# Patient Record
Sex: Female | Born: 1935 | Race: White | Hispanic: No | Smoking: Never smoker
Health system: Southern US, Community
[De-identification: ages and names within clinical notes are randomized; demographics above are authoritative.]

## PROBLEM LIST (undated history)

## (undated) DIAGNOSIS — H409 Unspecified glaucoma: Secondary | ICD-10-CM

## (undated) DIAGNOSIS — N289 Disorder of kidney and ureter, unspecified: Secondary | ICD-10-CM

## (undated) DIAGNOSIS — H548 Legal blindness, as defined in USA: Secondary | ICD-10-CM

## (undated) DIAGNOSIS — K219 Gastro-esophageal reflux disease without esophagitis: Secondary | ICD-10-CM

## (undated) DIAGNOSIS — S32030A Wedge compression fracture of third lumbar vertebra, initial encounter for closed fracture: Secondary | ICD-10-CM

## (undated) DIAGNOSIS — R296 Repeated falls: Secondary | ICD-10-CM

## (undated) DIAGNOSIS — R4189 Other symptoms and signs involving cognitive functions and awareness: Secondary | ICD-10-CM

## (undated) DIAGNOSIS — Z9981 Dependence on supplemental oxygen: Secondary | ICD-10-CM

## (undated) DIAGNOSIS — F0390 Unspecified dementia without behavioral disturbance: Secondary | ICD-10-CM

## (undated) DIAGNOSIS — I2699 Other pulmonary embolism without acute cor pulmonale: Secondary | ICD-10-CM

## (undated) DIAGNOSIS — W19XXXA Unspecified fall, initial encounter: Secondary | ICD-10-CM

## (undated) DIAGNOSIS — S32020A Wedge compression fracture of second lumbar vertebra, initial encounter for closed fracture: Secondary | ICD-10-CM

## (undated) HISTORY — DX: Wedge compression fracture of second lumbar vertebra, initial encounter for closed fracture: S32.020A

## (undated) HISTORY — PX: LAPAROSCOPIC CHOLECYSTECTOMY: SUR755

## (undated) HISTORY — DX: Wedge compression fracture of third lumbar vertebra, initial encounter for closed fracture: S32.030A

## (undated) HISTORY — DX: Unspecified glaucoma: H40.9

## (undated) HISTORY — DX: Repeated falls: R29.6

## (undated) HISTORY — DX: Gastro-esophageal reflux disease without esophagitis: K21.9

## (undated) HISTORY — PX: VAGINAL HYSTERECTOMY: SUR661

## (undated) HISTORY — PX: GLAUCOMA SURGERY: SHX656

## (undated) HISTORY — DX: Other symptoms and signs involving cognitive functions and awareness: R41.89

## (undated) HISTORY — DX: Unspecified fall, initial encounter: W19.XXXA

## (undated) HISTORY — PX: EXCISIONAL HEMORRHOIDECTOMY: SHX1541

---

## 2015-07-18 DIAGNOSIS — S32030A Wedge compression fracture of third lumbar vertebra, initial encounter for closed fracture: Secondary | ICD-10-CM

## 2015-07-18 DIAGNOSIS — S32020A Wedge compression fracture of second lumbar vertebra, initial encounter for closed fracture: Secondary | ICD-10-CM

## 2015-07-18 HISTORY — DX: Wedge compression fracture of second lumbar vertebra, initial encounter for closed fracture: S32.020A

## 2015-07-18 HISTORY — DX: Wedge compression fracture of third lumbar vertebra, initial encounter for closed fracture: S32.030A

## 2015-08-07 ENCOUNTER — Non-Acute Institutional Stay (SKILLED_NURSING_FACILITY): Payer: Medicare Other | Admitting: Internal Medicine

## 2015-08-07 DIAGNOSIS — W19XXXD Unspecified fall, subsequent encounter: Secondary | ICD-10-CM

## 2015-08-07 DIAGNOSIS — M810 Age-related osteoporosis without current pathological fracture: Secondary | ICD-10-CM | POA: Diagnosis not present

## 2015-08-07 DIAGNOSIS — N3000 Acute cystitis without hematuria: Secondary | ICD-10-CM

## 2015-08-07 DIAGNOSIS — H409 Unspecified glaucoma: Secondary | ICD-10-CM | POA: Diagnosis not present

## 2015-08-07 DIAGNOSIS — S32020S Wedge compression fracture of second lumbar vertebra, sequela: Secondary | ICD-10-CM

## 2015-08-07 DIAGNOSIS — S32030S Wedge compression fracture of third lumbar vertebra, sequela: Secondary | ICD-10-CM | POA: Diagnosis not present

## 2015-08-07 DIAGNOSIS — R4189 Other symptoms and signs involving cognitive functions and awareness: Secondary | ICD-10-CM | POA: Diagnosis not present

## 2015-08-07 NOTE — Progress Notes (Signed)
MRN: [id number] Name: Kaitlyn Holloway  Sex: female Age: 81 y.o. DOB: 26-Sep-1926  PSC #: Pernell Dupre fram Facility/Room:501 Level Of Care: SNF Provider: Merrilee Seashore D Emergency Contacts: Extended Emergency Contact Information Primary Emergency Contact: KYNLIE, JANE Address: [address]          [place],  [address] Home Phone: [phone] Relation: None  Code Status:   Allergies: Review of patient's allergies indicates no known allergies.  Chief Complaint  Patient presents with  . New Admit To SNF    HPI: Patient is 81 y.o. female with GERD, falls, a compression fx and cognitive impairment who is being admitted to SNF from home for OT/PT 2/2 to recent falls at home. She has recently been tx as outpt for a UTI with macrobid. While at SNF pt will be followed for compression fx, tx with norco prn, osteoporosis tx with calcitonin and calcium + D and glaucoma tx with timolol drops.  Past Medical History  Diagnosis Date  . GERD (gastroesophageal reflux disease)   . Glaucoma   . Compression fracture of L2 (HCC)   . Compression fracture of L3 lumbar vertebra (HCC)   . Cognitive impairment   . Falls     History reviewed. No pertinent past surgical history.    Medication List       This list is accurate as of: 08/07/15 11:59 PM.  Always use your most recent med list.               bimatoprost 0.01 % Soln  Commonly known as:  LUMIGAN  1 drop at bedtime.     brimonidine 0.15 % ophthalmic solution  Commonly known as:  ALPHAGAN  1 drop 3 (three) times daily.     calcitonin (salmon) 200 UNIT/ACT nasal spray  Commonly known as:  MIACALCIN/FORTICAL  Place 1 spray into alternate nostrils daily.     calcium-vitamin D 500-200 MG-UNIT tablet  Commonly known as:  OSCAL WITH D  Take 1 tablet by mouth 2 (two) times daily.     HYDROcodone-acetaminophen 5-325 MG tablet  Commonly known as:  NORCO/VICODIN  Take 1 tablet by mouth every 8 (eight) hours as needed for moderate  pain.     timolol 0.25 % ophthalmic solution  Commonly known as:  BETIMOL  1-2 drops 2 (two) times daily.        Meds ordered this encounter  Medications  . bimatoprost (LUMIGAN) 0.01 % SOLN    Sig: 1 drop at bedtime.  . brimonidine (ALPHAGAN) 0.15 % ophthalmic solution    Sig: 1 drop 3 (three) times daily.  . timolol (BETIMOL) 0.25 % ophthalmic solution    Sig: 1-2 drops 2 (two) times daily.  Marland Kitchen HYDROcodone-acetaminophen (NORCO/VICODIN) 5-325 MG tablet    Sig: Take 1 tablet by mouth every 8 (eight) hours as needed for moderate pain.  . calcitonin, salmon, (MIACALCIN/FORTICAL) 200 UNIT/ACT nasal spray    Sig: Place 1 spray into alternate nostrils daily.  . calcium-vitamin D (OSCAL WITH D) 500-200 MG-UNIT tablet    Sig: Take 1 tablet by mouth 2 (two) times daily.     There is no immunization history on file for this patient.  Social History  Substance Use Topics  . Smoking status: Unknown If Ever Smoked  . Smokeless tobacco: Not on file  . Alcohol Use: Not on file    Family history is not obtainable -pt denies anyone had any problems   Review of Systems  DATA OBTAINED: from nurse, medical record GENERAL:  no fevers, fatigue, appetite changes SKIN: No itching, rash or wounds EYES: No eye pain, redness, discharge EARS: No earache, tinnitus, change in hearing NOSE: No congestion, drainage or bleeding  MOUTH/THROAT: No mouth or tooth pain, No sore throat RESPIRATORY: No cough, wheezing, SOB CARDIAC: No chest pain, palpitations, lower extremity edema  GI: No abdominal pain, No N/V/D or constipation, No heartburn or reflux  GU: No dysuria, frequency or urgency, or incontinence  MUSCULOSKELETAL: No unrelieved bone/joint pain NEUROLOGIC: No headache, dizziness or focal weakness PSYCHIATRIC: No c/o anxiety or sadness   Filed Vitals:   08/18/15 1440  BP: 129/72  Pulse: 88  Temp: 97.5 F (36.4 C)  Resp: 20    SpO2 Readings from Last 1 Encounters:  No data found for  SpO2        Physical Exam  GENERAL APPEARANCE: Alert, min conversant,  No acute distress.  SKIN: No diaphoresis rash HEAD: Normocephalic, atraumatic  EYES: Conjunctiva/lids clear. Pupils round, reactive. EOMs intact.  EARS: External exam WNL, canals clear. Hearing grossly normal.  NOSE: No deformity or discharge.  MOUTH/THROAT: Lips w/o lesions  RESPIRATORY: Breathing is even, unlabored. Lung sounds are clear   CARDIOVASCULAR: Heart RRR no murmurs, rubs or gallops. No peripheral edema.   GASTROINTESTINAL: Abdomen is soft, non-tender, not distended w/ normal bowel sounds. GENITOURINARY: Bladder non tender, not distended  MUSCULOSKELETAL: No abnormal joints or musculature NEUROLOGIC:  Cranial nerves 2-12 grossly intact. Moves all extremities  PSYCHIATRIC: probable dementia,appears overwhelmed no behavioral issues  Patient Active Problem List   Diagnosis Date Noted  . UTI (urinary tract infection) 08/18/2015  . Glaucoma 08/18/2015  . Osteoporosis 08/18/2015  . GERD (gastroesophageal reflux disease)   . Compression fracture of L2 (HCC)   . Compression fracture of L3 lumbar vertebra (HCC)   . Cognitive impairment   . Falls     CBC No results found for: WBC, RBC, HGB, HCT, PLT, MCV, LYMPHSABS, MONOABS, EOSABS, BASOSABS  CMP  No results found for: NA, K, CL, CO2, GLUCOSE, BUN, CREATININE, CALCIUM, PROT, ALBUMIN, AST, ALT, ALKPHOS, BILITOT, GFRNONAA, GFRAA  No results found for: HGBA1C   Patient was never admitted.  Not all labs, radiology exams or other studies done during hospitalization come through on my EPIC note; however they are reviewed by me.    Assessment and Plan  Falls SNF - at home and with UTI; OT/PT  Cognitive impairment SNF - pt probably has dementia;Plan - will have her seen by ST  Glaucoma SNF - cont timolol drops  Compression fracture of L3 lumbar vertebra (HCC) SNF - OT/PT and norco prn  Compression fracture of L2 (HCC) SNF - OT/PT; norco  prn  Osteoporosis With compression fx; SNF - cont calcitonin and calcium + D   Time spent > 35 min Margit Hanks, MD

## 2015-08-18 ENCOUNTER — Encounter: Payer: Self-pay | Admitting: Internal Medicine

## 2015-08-18 DIAGNOSIS — M810 Age-related osteoporosis without current pathological fracture: Secondary | ICD-10-CM | POA: Insufficient documentation

## 2015-08-18 DIAGNOSIS — F0390 Unspecified dementia without behavioral disturbance: Secondary | ICD-10-CM | POA: Insufficient documentation

## 2015-08-18 DIAGNOSIS — S32030A Wedge compression fracture of third lumbar vertebra, initial encounter for closed fracture: Secondary | ICD-10-CM | POA: Insufficient documentation

## 2015-08-18 DIAGNOSIS — N390 Urinary tract infection, site not specified: Secondary | ICD-10-CM | POA: Insufficient documentation

## 2015-08-18 DIAGNOSIS — R296 Repeated falls: Secondary | ICD-10-CM | POA: Insufficient documentation

## 2015-08-18 DIAGNOSIS — S32020A Wedge compression fracture of second lumbar vertebra, initial encounter for closed fracture: Secondary | ICD-10-CM | POA: Insufficient documentation

## 2015-08-18 DIAGNOSIS — H409 Unspecified glaucoma: Secondary | ICD-10-CM | POA: Insufficient documentation

## 2015-08-18 DIAGNOSIS — K219 Gastro-esophageal reflux disease without esophagitis: Secondary | ICD-10-CM | POA: Insufficient documentation

## 2015-08-18 DIAGNOSIS — W19XXXA Unspecified fall, initial encounter: Secondary | ICD-10-CM | POA: Insufficient documentation

## 2015-08-18 NOTE — Assessment & Plan Note (Signed)
SNF - pt probably has dementia;Plan - will have her seen by ST

## 2015-08-18 NOTE — Assessment & Plan Note (Signed)
SNF - at home and with UTI; OT/PT

## 2015-08-18 NOTE — Assessment & Plan Note (Signed)
SNF - OT/PT; norco prn

## 2015-08-18 NOTE — Assessment & Plan Note (Signed)
SNF - OT/PT and norco prn

## 2015-08-18 NOTE — Assessment & Plan Note (Signed)
SNF - cont timolol drops

## 2015-08-18 NOTE — Assessment & Plan Note (Signed)
With compression fx; SNF - cont calcitonin and calcium + D

## 2015-08-26 ENCOUNTER — Inpatient Hospital Stay (HOSPITAL_COMMUNITY): Payer: Medicare Other

## 2015-08-26 ENCOUNTER — Non-Acute Institutional Stay (SKILLED_NURSING_FACILITY): Payer: Medicare Other | Admitting: Internal Medicine

## 2015-08-26 ENCOUNTER — Emergency Department (HOSPITAL_COMMUNITY): Payer: Medicare Other

## 2015-08-26 ENCOUNTER — Encounter (HOSPITAL_COMMUNITY): Payer: Self-pay | Admitting: Emergency Medicine

## 2015-08-26 ENCOUNTER — Encounter: Payer: Self-pay | Admitting: Internal Medicine

## 2015-08-26 ENCOUNTER — Inpatient Hospital Stay (HOSPITAL_COMMUNITY)
Admission: EM | Admit: 2015-08-26 | Discharge: 2015-08-29 | DRG: 175 | Disposition: A | Discharge status: Skilled Nursing Facility | Payer: Medicare Other | Attending: Internal Medicine | Admitting: Internal Medicine

## 2015-08-26 DIAGNOSIS — H548 Legal blindness, as defined in USA: Secondary | ICD-10-CM | POA: Diagnosis present

## 2015-08-26 DIAGNOSIS — R609 Edema, unspecified: Secondary | ICD-10-CM | POA: Diagnosis not present

## 2015-08-26 DIAGNOSIS — I82402 Acute embolism and thrombosis of unspecified deep veins of left lower extremity: Secondary | ICD-10-CM | POA: Diagnosis not present

## 2015-08-26 DIAGNOSIS — M4856XA Collapsed vertebra, not elsewhere classified, lumbar region, initial encounter for fracture: Secondary | ICD-10-CM | POA: Diagnosis present

## 2015-08-26 DIAGNOSIS — R0600 Dyspnea, unspecified: Secondary | ICD-10-CM | POA: Diagnosis not present

## 2015-08-26 DIAGNOSIS — K449 Diaphragmatic hernia without obstruction or gangrene: Secondary | ICD-10-CM | POA: Diagnosis present

## 2015-08-26 DIAGNOSIS — Z66 Do not resuscitate: Secondary | ICD-10-CM | POA: Diagnosis present

## 2015-08-26 DIAGNOSIS — Z880 Allergy status to penicillin: Secondary | ICD-10-CM | POA: Diagnosis not present

## 2015-08-26 DIAGNOSIS — N289 Disorder of kidney and ureter, unspecified: Secondary | ICD-10-CM | POA: Diagnosis present

## 2015-08-26 DIAGNOSIS — Z79899 Other long term (current) drug therapy: Secondary | ICD-10-CM

## 2015-08-26 DIAGNOSIS — F0390 Unspecified dementia without behavioral disturbance: Secondary | ICD-10-CM | POA: Diagnosis present

## 2015-08-26 DIAGNOSIS — R000 Tachycardia, unspecified: Secondary | ICD-10-CM | POA: Diagnosis present

## 2015-08-26 DIAGNOSIS — R0602 Shortness of breath: Secondary | ICD-10-CM

## 2015-08-26 DIAGNOSIS — Z9981 Dependence on supplemental oxygen: Secondary | ICD-10-CM

## 2015-08-26 DIAGNOSIS — J9601 Acute respiratory failure with hypoxia: Secondary | ICD-10-CM | POA: Diagnosis present

## 2015-08-26 DIAGNOSIS — Z882 Allergy status to sulfonamides status: Secondary | ICD-10-CM | POA: Diagnosis not present

## 2015-08-26 DIAGNOSIS — H540 Blindness, both eyes: Secondary | ICD-10-CM | POA: Diagnosis not present

## 2015-08-26 DIAGNOSIS — Z91013 Allergy to seafood: Secondary | ICD-10-CM

## 2015-08-26 DIAGNOSIS — H409 Unspecified glaucoma: Secondary | ICD-10-CM | POA: Diagnosis present

## 2015-08-26 DIAGNOSIS — R339 Retention of urine, unspecified: Secondary | ICD-10-CM | POA: Diagnosis present

## 2015-08-26 DIAGNOSIS — I2699 Other pulmonary embolism without acute cor pulmonale: Principal | ICD-10-CM

## 2015-08-26 DIAGNOSIS — K219 Gastro-esophageal reflux disease without esophagitis: Secondary | ICD-10-CM | POA: Diagnosis present

## 2015-08-26 DIAGNOSIS — J9600 Acute respiratory failure, unspecified whether with hypoxia or hypercapnia: Secondary | ICD-10-CM

## 2015-08-26 DIAGNOSIS — R791 Abnormal coagulation profile: Secondary | ICD-10-CM

## 2015-08-26 DIAGNOSIS — I82412 Acute embolism and thrombosis of left femoral vein: Secondary | ICD-10-CM | POA: Diagnosis present

## 2015-08-26 DIAGNOSIS — R0902 Hypoxemia: Secondary | ICD-10-CM | POA: Diagnosis present

## 2015-08-26 DIAGNOSIS — H547 Unspecified visual loss: Secondary | ICD-10-CM | POA: Diagnosis present

## 2015-08-26 DIAGNOSIS — Z9181 History of falling: Secondary | ICD-10-CM

## 2015-08-26 DIAGNOSIS — I824Y2 Acute embolism and thrombosis of unspecified deep veins of left proximal lower extremity: Secondary | ICD-10-CM | POA: Diagnosis not present

## 2015-08-26 DIAGNOSIS — R0689 Other abnormalities of breathing: Secondary | ICD-10-CM

## 2015-08-26 DIAGNOSIS — R7989 Other specified abnormal findings of blood chemistry: Secondary | ICD-10-CM | POA: Diagnosis present

## 2015-08-26 HISTORY — DX: Other pulmonary embolism without acute cor pulmonale: I26.99

## 2015-08-26 HISTORY — DX: Dependence on supplemental oxygen: Z99.81

## 2015-08-26 HISTORY — DX: Legal blindness, as defined in USA: H54.8

## 2015-08-26 HISTORY — DX: Unspecified dementia, unspecified severity, without behavioral disturbance, psychotic disturbance, mood disturbance, and anxiety: F03.90

## 2015-08-26 HISTORY — DX: Disorder of kidney and ureter, unspecified: N28.9

## 2015-08-26 LAB — CBC WITH DIFFERENTIAL/PLATELET
BASOS ABS: 0 10*3/uL (ref 0.0–0.1)
BASOS PCT: 0 %
Eosinophils Absolute: 0 10*3/uL (ref 0.0–0.7)
Eosinophils Relative: 0 %
HCT: 45.2 % (ref 36.0–46.0)
Hemoglobin: 14.8 g/dL (ref 12.0–15.0)
LYMPHS PCT: 22 %
Lymphs Abs: 2.4 10*3/uL (ref 0.7–4.0)
MCH: 29 pg (ref 26.0–34.0)
MCHC: 32.7 g/dL (ref 30.0–36.0)
MCV: 88.5 fL (ref 78.0–100.0)
MONO ABS: 1.2 10*3/uL — AB (ref 0.1–1.0)
Monocytes Relative: 11 %
Neutro Abs: 7.3 10*3/uL (ref 1.7–7.7)
Neutrophils Relative %: 67 %
PLATELETS: 194 10*3/uL (ref 150–400)
RBC: 5.11 MIL/uL (ref 3.87–5.11)
RDW: 14.5 % (ref 11.5–15.5)
WBC: 10.9 10*3/uL — ABNORMAL HIGH (ref 4.0–10.5)

## 2015-08-26 LAB — BASIC METABOLIC PANEL
ANION GAP: 15 (ref 5–15)
BUN: 30 mg/dL — AB (ref 6–20)
CALCIUM: 9.3 mg/dL (ref 8.9–10.3)
CO2: 20 mmol/L — ABNORMAL LOW (ref 22–32)
CREATININE: 1.42 mg/dL — AB (ref 0.44–1.00)
Chloride: 99 mmol/L — ABNORMAL LOW (ref 101–111)
GFR calc Af Amer: 37 mL/min — ABNORMAL LOW (ref >60)
GFR, EST NON AFRICAN AMERICAN: 32 mL/min — AB (ref >60)
GLUCOSE: 150 mg/dL — AB (ref 65–99)
Potassium: 5 mmol/L (ref 3.5–5.1)
Sodium: 134 mmol/L — ABNORMAL LOW (ref 135–145)

## 2015-08-26 LAB — D-DIMER, QUANTITATIVE: D-Dimer, Quant: 11 ug/mL-FEU — ABNORMAL HIGH (ref 0.00–0.48)

## 2015-08-26 MED ORDER — CALCIUM CARBONATE-VITAMIN D 500-200 MG-UNIT PO TABS
1.0000 | ORAL_TABLET | Freq: Two times a day (BID) | ORAL | Status: DC
  Administered 2015-08-26 – 2015-08-29 (×6): 1 via ORAL
  Filled 2015-08-26 (×6): qty 1

## 2015-08-26 MED ORDER — SENNA 8.6 MG PO TABS
1.0000 | ORAL_TABLET | Freq: Two times a day (BID) | ORAL | Status: DC
  Administered 2015-08-26 – 2015-08-29 (×6): 8.6 mg via ORAL
  Filled 2015-08-26 (×6): qty 1

## 2015-08-26 MED ORDER — BISACODYL 10 MG RE SUPP: 10.0000 mg | Freq: Every day | RECTAL | Status: DC | PRN

## 2015-08-26 MED ORDER — LATANOPROST 0.005 % OP SOLN
1.0000 [drp] | Freq: Every day | OPHTHALMIC | Status: DC
  Administered 2015-08-26 – 2015-08-28 (×3): 1 [drp] via OPHTHALMIC
  Filled 2015-08-26: qty 2.5

## 2015-08-26 MED ORDER — SODIUM CHLORIDE 0.9 % IV SOLN
INTRAVENOUS | Status: DC
  Administered 2015-08-27: via INTRAVENOUS
  Administered 2015-08-29: 10 mL/h via INTRAVENOUS

## 2015-08-26 MED ORDER — ONDANSETRON HCL 4 MG/2ML IJ SOLN
4.0000 mg | Freq: Four times a day (QID) | INTRAMUSCULAR | Status: DC | PRN
  Administered 2015-08-27: 4 mg via INTRAVENOUS
  Filled 2015-08-26: qty 2

## 2015-08-26 MED ORDER — ENOXAPARIN SODIUM 120 MG/0.8ML ~~LOC~~ SOLN
1.5000 mg/kg | Freq: Once | SUBCUTANEOUS | Status: AC
  Administered 2015-08-26: 105 mg via SUBCUTANEOUS
  Filled 2015-08-26: qty 0.8

## 2015-08-26 MED ORDER — SODIUM CHLORIDE 0.9 % IV BOLUS (SEPSIS)
1000.0000 mL | Freq: Once | INTRAVENOUS | Status: AC
  Administered 2015-08-26: 1000 mL via INTRAVENOUS

## 2015-08-26 MED ORDER — ONDANSETRON HCL 4 MG PO TABS: 4.0000 mg | ORAL_TABLET | Freq: Four times a day (QID) | ORAL | Status: DC | PRN

## 2015-08-26 MED ORDER — TIMOLOL MALEATE 0.25 % OP SOLN
1.0000 [drp] | Freq: Two times a day (BID) | OPHTHALMIC | Status: DC
  Administered 2015-08-26: 1 [drp] via OPHTHALMIC
  Administered 2015-08-27: 2 [drp] via OPHTHALMIC
  Administered 2015-08-27 – 2015-08-28 (×2): 1 [drp] via OPHTHALMIC
  Administered 2015-08-28: 2 [drp] via OPHTHALMIC
  Administered 2015-08-29: 1 [drp] via OPHTHALMIC
  Filled 2015-08-26: qty 5

## 2015-08-26 MED ORDER — ACETAMINOPHEN 650 MG RE SUPP: 650.0000 mg | Freq: Four times a day (QID) | RECTAL | Status: DC | PRN

## 2015-08-26 MED ORDER — ACETAMINOPHEN 325 MG PO TABS
650.0000 mg | ORAL_TABLET | Freq: Four times a day (QID) | ORAL | Status: DC | PRN
  Administered 2015-08-27: 650 mg via ORAL
  Filled 2015-08-26: qty 2

## 2015-08-26 MED ORDER — CALCITONIN (SALMON) 200 UNIT/ACT NA SOLN
1.0000 | Freq: Every day | NASAL | Status: DC
  Administered 2015-08-27 – 2015-08-28 (×2): 1 via NASAL
  Filled 2015-08-26: qty 3.7

## 2015-08-26 MED ORDER — ENOXAPARIN SODIUM 80 MG/0.8ML ~~LOC~~ SOLN
70.0000 mg | SUBCUTANEOUS | Status: DC
  Administered 2015-08-27: 70 mg via SUBCUTANEOUS
  Filled 2015-08-26: qty 0.8

## 2015-08-26 NOTE — Progress Notes (Signed)
ANTICOAGULATION CONSULT NOTE - Initial Consult  Pharmacy Consult for lovenox Indication: r/o PE + DVT  Allergies  Allergen Reactions  . Penicillins Hives and Swelling  . Shellfish-Derived Products Hives  . Sulfa Antibiotics Hives    Patient Measurements: Height:  (157.5 cm) Weight: 155 lb (70.308 kg) IBW/kg (Calculated) : 50.1  Vital Signs: Temp: 97.3 F (36.3 C) (10/10 1409) BP: 122/90 mmHg (10/10 2145) Pulse Rate: 97 (10/10 2145)  Labs:  Recent Labs  08/26/15 1907  HGB 14.8  HCT 45.2  PLT 194  CREATININE 1.42*    Estimated Creatinine Clearance: 25.6 mL/min (by C-G formula based on Cr of 1.42).   Medical History: Past Medical History  Diagnosis Date  . GERD (gastroesophageal reflux disease)   . Glaucoma   . Compression fracture of L2 (HCC)   . Compression fracture of L3 lumbar vertebra (HCC)   . Cognitive impairment   . Falls    Assessment: 82 yof presented to the ED with SOB. To start lovenox for possible PE and DVT with plans for VQ scan and dopplers tomorrow morning. Baseline CBC is WNL and she is not on anticoagulation PTA. Scr is elevated at 1.42 and CrCl is <43ml/min. Already received a dose of lovenox 1.5mg /kg earlier tonight. Due to dose and renal function, pt will not need another dose until tomorrow night.   Goal of Therapy:  Heparin level 0.3-0.7 units/ml Anti-Xa level 0.6-1 units/ml 4hrs after LMWH dose given Monitor platelets by anticoagulation protocol: Yes   Plan:  - Lovenox  SQ Q24H starting tomorrow night - CBC Q72H - F/u VQ and dopplers  Zalen Sequeira, Drake Leach 08/26/2015,11:20 PM

## 2015-08-26 NOTE — ED Notes (Signed)
hospitalist at bedside

## 2015-08-26 NOTE — Progress Notes (Signed)
Patient ID: Kaitlyn Holloway, female   DOB: 01-24-1926, 81 y.o.   MRN: [id number] MRN: [id number] Name: Kaitlyn Holloway  Sex: female Age: 80 y.o. DOB: 1926/02/22  PSC #: Pernell Dupre fram Facility/Room:501 Level Of Care: SNF Provider: Roena Malady Emergency Contacts: Extended Emergency Contact Information Primary Emergency Contact: Beverly Milch [place] Mobile Phone: [phone] Relation: Daughter  Code Status:   Allergies: Penicillins; Shellfish-derived products; and Sulfa antibiotics  Chief Complaint  Patient presents with  . Acute Visit   secondary to hypoxia with exertion  HPI: Patient is 81 y.o. female with GERD, falls, a compression fx and cognitive impairment who was admitted to SNF from home for OT/PT 2/2 to recent falls at home. She had recently been tx as outpt for a UTI with macrobid. While at SNF  followed for compression fx, tx with norco prn, osteoporosis tx with calcitonin and calcium + D and glaucoma tx with timolol drops. Per nursing staff she developed hypoxia today with exertion with O2 stat going into the 80s this rose into the 90s with oxygen supplementation.  Nursing does not report any acute distress vital signs are stable.  I do not see that she has a significant respiratory or cardiac history-. per nursing staff this is a somewhat new finding for patient    Past Medical History  Diagnosis Date  . GERD (gastroesophageal reflux disease)   . Glaucoma   . Compression fracture of L2 (HCC) 07/2015  . Compression fracture of L3 lumbar vertebra (HCC) 07/2015  . Cognitive impairment   . Falls     "has fallen 4-5 times in the last 4 years" (08/27/2015)  . Dementia   . Renal insufficiency     [name] 08/26/2015  . Legally blind     "both eyes; from the glaucoma"  . Pulmonary emboli (HCC) 08/26/2015  . On home oxygen therapy     "2 1/2L; at rehab facility; intermittent; since yesterday" (08/26/2015)    Past Surgical History  Procedure  Laterality Date  . Laparoscopic cholecystectomy    . Vaginal hysterectomy    . Excisional hemorrhoidectomy    . Glaucoma surgery Right       Medication List       This list is accurate as of: 08/26/15  5:47 PM.  Always use your most recent med list.               apixaban 5 MG Tabs tablet  Commonly known as:  ELIQUIS  Least take 2 tablets(10 mg) oral 2 times daily for next 5 days, then decrease to 1 table t(5 mg) oral  2 times daily from 09/04/2015.     bimatoprost 0.01 % Soln  Commonly known as:  LUMIGAN  Place 1 drop into both eyes at bedtime.     brimonidine 0.15 % ophthalmic solution  Commonly known as:  ALPHAGAN  1 drop 3 (three) times daily.     calcitonin (salmon) 200 UNIT/ACT nasal spray  Commonly known as:  MIACALCIN/FORTICAL  Place 1 spray into alternate nostrils daily.     calcium-vitamin D 500-200 MG-UNIT tablet  Commonly known as:  OSCAL WITH D  Take 1 tablet by mouth 2 (two) times daily.     feeding supplement (ENSURE ENLIVE) Liqd  Take 237 mLs by mouth 2 (two) times daily between meals.     HYDROcodone-acetaminophen 5-325 MG tablet  Commonly known as:  NORCO/VICODIN  Take 1 tablet by mouth every 8 (eight) hours as needed for moderate  pain.     timolol 0.25 % ophthalmic solution  Commonly known as:  BETIMOL  Place 1-2 drops into both eyes 2 (two) times daily.            Social History  Substance Use Topics  . Smoking status: Never Smoker   . Smokeless tobacco: Never Used  . Alcohol Use: No    Family history is not obtainable -pt denies anyone had any problems   Review of Systems  DATA OBTAINED: from nurse, medical record and patient patient is a poor historian however GENERAL:  no fevers, fatigue, appetite changes SKIN: No itching, rash or wounds EYES: No eye pain, redness, discharge EARS: No earache, tinnitus, change in hearing NOSE: No congestion, drainage or bleeding  MOUTH/THROAT: No mouth or tooth pain, No sore  throat RESPIRATORY: No cough, wheezing, SOB has had decreased oxygen saturations with exertion CARDIAC: No chest pain, palpitations, lower extremity edema  GI: No abdominal pain, No N/V/D or constipation, No heartburn or reflux  GU: No dysuria, frequency or urgency, or incontinence  MUSCULOSKELETAL: No unrelieved bone/joint pain NEUROLOGIC: No headache, dizziness or focal weakness PSYCHIATRIC: No c/o anxiety or sadness   Filed Vitals:   08/26/15 1409  BP: 110/70  Pulse: 92  Temp: 97.3 F (36.3 C)  Resp: 22    O2 saturation is 92% on 2 L oxygen      Physical Exam  GENERAL APPEARANCE: Alert, min conversant,  No acute distress.  SKIN: No diaphoresis rash HEAD: Normocephalic, atraumatic  EYES: Conjunctiva/lids clear. Pupils round, reactive. EOMs intact.  EARS: External exam WNL, canals clear. Hearing grossly normal.  NOSE: No deformity or discharge.  MOUTH/THROAT: Lips w/o lesions  RESPIRATORY: Breathing is even, unlabored. Lung sounds are clear --with any exertion however there appears to be some use of accessory muscles -- CARDIOVASCULAR: Heart RRR no murmurs, rubs or gallops. mild peripheral edema.   GASTROINTESTINAL: Abdomen is soft, non-tender, not distended w/ normal bowel sounds. GENITOURINARY: Bladder non tender, not distended  MUSCULOSKELETAL: No abnormal joints or musculature NEUROLOGIC:  Cranial nerves 2-12 grossly intact. Moves all extremities  PSYCHIATRIC: probable dementia, no behavioral issues--she is conversant but somewhat confused  Patient Active Problem List   Diagnosis Date Noted  . Constipation, chronic 08/31/2015  . Acute respiratory failure (HCC) 08/29/2015  . Acute pulmonary embolism (HCC) 08/29/2015  . Urinary retention 08/29/2015  . Acute deep vein thrombosis (DVT) of left lower extremity (HCC) 08/27/2015  . Hypoxemia 08/26/2015  . D-dimer, elevated 08/26/2015  . Dyspnea 08/26/2015  . Blind 08/26/2015  . Glaucoma 08/18/2015  . Osteoporosis  08/18/2015  . GERD (gastroesophageal reflux disease)   . Compression fracture of L2 (HCC)   . Compression fracture of L3 lumbar vertebra (HCC)   . Dementia without behavioral disturbance   . Falls       Labs-none noted in chart          Assessment and Plan  Hypoxia with exertion-we have ordered a chest x-ray which has come back negative for any acute process-I did reevaluate patient she does have some use of accessory muscles with any exertion and has some trouble speaking in full sentences-this was discussed with Dr. Lyn Hollingshead via phone-will send her to the ER for expedient evaluation one would want to rule out any acute possibly cardiac issue or possibly an embolism  Clinically she is not in any distress per serial exams but would be hesitant to not have this expediently evaluated.  [id number]--of note greater than 40 minutes  spent assessing patient-reassessing patient-reviewing her chart-and coordinating formulating a plan of care with Dr. Mardelle Matte input as well as discussion with nursing-no greater than 50% of time spent coordinating plan of care   Skylan Gift C,

## 2015-08-26 NOTE — ED Provider Notes (Signed)
CSN: [id number]     Arrival date & time 08/26/15  1747 History   First MD Initiated Contact with Patient 08/26/15 1754     Chief complaint: Shortness of breath   (Consider location/radiation/quality/duration/timing/severity/associated sxs/prior Treatment) The history is provided by the patient, the EMS personnel and a relative. No language interpreter was used.  Level V caveat  Ms. Nellums is an 81 y.o female with a history right hip fracture and vertebral compression fracture who presents from [facility] rehabilitation for gradual onset and worsening shortness of breath for the past couple of days. From the notes at the rehabilitation center she has a history of new onset memory loss. Per the notes she is mostly sedentary and needs assistance but has had several recent falls due to lack of cooperation with assistance. She has no history of CHF, MI, or obstructive pulmonary disease. She denies any recent illness, cough, fever, chills, chest pain, abdominal pain, nausea, vomiting, diarrhea, or urinary frequency or dysuria. He has no history of DVT or PE.  Her daughter wasn't able to provide more information regarding her care. She states that she has been on oxygen since yesterday and was concerned when she went to visit her at the rehab center. She has been mostly sedentary at rehabilitation.  Past Medical History  Diagnosis Date  . GERD (gastroesophageal reflux disease)   . Glaucoma   . Compression fracture of L2 (HCC) 07/2015  . Compression fracture of L3 lumbar vertebra (HCC) 07/2015  . Cognitive impairment   . Falls     "has fallen 4-5 times in the last 4 years" (08/27/2015)  . Dementia   . Renal insufficiency     [name] 08/26/2015  . Legally blind     "both eyes; from the glaucoma"  . Pulmonary emboli (HCC) 08/26/2015  . On home oxygen therapy     "2 1/2L; at rehab facility; intermittent; since yesterday" (08/26/2015)   Past Surgical History  Procedure Laterality Date  .  Laparoscopic cholecystectomy    . Vaginal hysterectomy    . Excisional hemorrhoidectomy    . Glaucoma surgery Right    History reviewed. No pertinent family history. Social History  Substance Use Topics  . Smoking status: Never Smoker   . Smokeless tobacco: Never Used  . Alcohol Use: No   OB History    No data available     Review of Systems  Unable to perform ROS: Dementia  All other systems reviewed and are negative.     Allergies  Penicillins; Shellfish-derived products; and Sulfa antibiotics  Home Medications   Prior to Admission medications   Medication Sig Start Date End Date Taking? Authorizing Provider  bimatoprost (LUMIGAN) 0.01 % SOLN Place 1 drop into both eyes at bedtime.    Yes Historical Provider, MD  bisacodyl (DULCOLAX) 10 MG suppository Place 10 mg rectally as needed for moderate constipation.   Yes Historical Provider, MD  calcitonin, salmon, (MIACALCIN/FORTICAL) 200 UNIT/ACT nasal spray Place 1 spray into alternate nostrils daily.   Yes Historical Provider, MD  calcium-vitamin D (OSCAL WITH D) 500-200 MG-UNIT tablet Take 1 tablet by mouth 2 (two) times daily.   Yes Historical Provider, MD  HYDROcodone-acetaminophen (NORCO/VICODIN) 5-325 MG tablet Take 1 tablet by mouth every 8 (eight) hours as needed for moderate pain.   Yes Historical Provider, MD  Influenza vac split quadrivalent PF (FLUARIX) 0.5 ML injection Inject 0.5 mLs into the muscle once.   Yes Historical Provider, MD  magnesium hydroxide (MILK OF MAGNESIA)  400 MG/5ML suspension Take 30 mLs by mouth daily as needed for mild constipation.   Yes Historical Provider, MD  Sodium Phosphates (RA SALINE ENEMA RE) Place 1 each rectally as needed (for constipation).   Yes Historical Provider, MD  timolol (BETIMOL) 0.25 % ophthalmic solution Place 1-2 drops into both eyes 2 (two) times daily.    Yes Historical Provider, MD   BP 122/90 mmHg  Pulse 97  Resp 16  Ht  (1.575 m)  Wt 155 lb (70.308 kg)   BMI 28.34 kg/m2  SpO2 97% Physical Exam  Constitutional: She is oriented to person, place, and time. She appears well-developed and well-nourished.  She is well-appearing and in able to answer questions appropriately without being out of breath or using accessory muscles.  HENT:  Head: Normocephalic and atraumatic.  Eyes: Conjunctivae are normal.  Neck: Normal range of motion. Neck supple.  Cardiovascular: Normal rate, regular rhythm and normal heart sounds.   Pulmonary/Chest: Effort normal and breath sounds normal.  No wheezing or use of accessory muscles. She is running in the upper 80s on room air. Put on 2 L of oxygen she is at 93-94%.  Abdominal: Soft. There is no tenderness.  Musculoskeletal: Normal range of motion.  Tenderness to palpation of her bilateral lower extremities as well as her thighs. No point tenderness or palpable cord. The size of her calves are similar in appearance. There is no erythema or signs of infection. She has a 2+ DP pulse in her bilateral extremities. 2+ radial pulses bilaterally.  Neurological: She is alert and oriented to person, place, and time.  Skin: Skin is warm and dry.  Nursing note and vitals reviewed.   ED Course  Procedures (including critical care time) Labs Review Labs Reviewed  CBC WITH DIFFERENTIAL/PLATELET - Abnormal; Notable for the following:    WBC 10.9 (*)    Monocytes Absolute 1.2 (*)    All other components within normal limits  BASIC METABOLIC PANEL - Abnormal; Notable for the following:    Sodium 134 (*)    Chloride 99 (*)    CO2 20 (*)    Glucose, Bld 150 (*)    BUN 30 (*)    Creatinine, Ser 1.42 (*)    GFR calc non Af Amer 32 (*)    GFR calc Af Amer 37 (*)    All other components within normal limits  D-DIMER, QUANTITATIVE (NOT AT [facility]) - Abnormal; Notable for the following:    D-Dimer, Quant 11.00 (*)    All other components within normal limits  BASIC METABOLIC PANEL  CBC    Imaging Review Dg Chest 2  View  08/26/2015   CLINICAL DATA:  Shortness of breath tonight.  EXAM: CHEST  2 VIEW  COMPARISON:  None.  FINDINGS: Shallow inspiration. Heart size and pulmonary vascularity are normal. No focal airspace disease or consolidation in the lungs. No blunting of costophrenic angles. No pneumothorax. Moderate esophageal hiatal hernia behind the heart. Calcified and tortuous aorta. Mediastinal contours appear intact. Degenerative changes in the spine and shoulders. Old fracture deformity of the right shoulder.  IMPRESSION: No evidence of active pulmonary disease. Esophageal hiatal hernia behind the heart.   Electronically Signed   By: Burman Nieves M.D.   On: 08/26/2015 23:08   I have personally reviewed and evaluated these images and lab results as part of my medical decision-making.   EKG Interpretation   Date/Time:  Monday August 26 2015 17:56:12 EDT Ventricular Rate:  103 PR  Interval:  183 QRS Duration: 75 QT Interval:  384 QTC Calculation: 503 R Axis:   19 Text Interpretation:  Sinus tachycardia Low voltage, precordial leads  Borderline T abnormalities, diffuse leads Prolonged QT interval Baseline  wander in lead(s) II III aVL aVF Sinus tachycardia Artifact Abnormal ekg  Confirmed by Gerhard Munch  MD [phone]) on 08/26/2015 8:05:46 PM      MDM   Final diagnoses:  Hypoxia  Positive D dimer  Patient presents for shortness of breath from [facility] rehabilitation center. She is well-appearing and in no acute respiratory distress. She is on 2 L of oxygen and saturation is in the upper 90's. Her labs are comparable to previous labs. Chest x-ray shows no evidence of acute pulmonary disease. She has an incidental esophageal hiatal hernia. Due to her kidney function I could not order a CT scan with contrast to rule out PE. Her d-dimer was 11. I started her on Lovenox and I will admit for a VQ scan to be done in the morning.  Her daughter who is at bedside agrees with the plan. I discussed  this patient with Dr. Arlean Hopping who will admit to telemetry for hypoxia and elevated D-dimer.    Catha Gosselin, PA-C 08/27/15 0023  Gerhard Munch, MD 08/27/15 1537

## 2015-08-26 NOTE — H&P (Signed)
[facility] History and Physical  Kaitlyn Holloway XBM:[id number] DOB: [birth year] DOA: 08/26/2015  Referring physician: Marita Kansas, PA PCP: Deloris Ping, MD   Chief Complaint: SOB  HPI: Kaitlyn Holloway is a 81 y.o. 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 She has had progressive dementia for several years. Daughter provides history. She was living at home until a few weeks ago and she had to place her in a SNF/ rehab center.  The daughter was visiting and saw they had put her on nasal oxygen.  She had low SaO2 off the oxygen. So CXR was done at the SNF , reportedly normal.  Patient sent here. Patient has no c/o's, but desat's in the low 80's off of oxygen.  D-dimer high at 11.0.  Asked to see patient for probable PE.   Daughter is primary caretaker.  She says no heroic measures, no dialysis or other invasive procedures.  OK to treat conditions medically if non-invasive.  DNR.    No cough, CP, no abd pain, no n/v/d.  No joint pain, calf pain, no hx DVT or PE.    Where does patient live rehab place Can patient participate in ADLs? Not much  Past Medical History  Past Medical History  Diagnosis Date  . GERD (gastroesophageal reflux disease)   . Glaucoma   . Compression fracture of L2 (HCC)   . Compression fracture of L3 lumbar vertebra (HCC)   . Cognitive impairment   . Falls    Past Surgical History History reviewed. No pertinent past surgical history. Family History History reviewed. No pertinent family history. Social History  reports that she has never smoked. She has never used smokeless tobacco. She reports that she does not drink alcohol. Her drug history is not on file. Allergies  Allergies  Allergen Reactions  . Penicillins Hives and Swelling  . Shellfish-Derived Products Hives  . Sulfa Antibiotics Hives   Home medications Prior to Admission medications   Medication Sig Start Date End Date Taking? Authorizing Provider  bimatoprost  (LUMIGAN) 0.01 % SOLN Place 1 drop into both eyes at bedtime.    Yes Historical Provider, MD  bisacodyl (DULCOLAX) 10 MG suppository Place 10 mg rectally as needed for moderate constipation.   Yes Historical Provider, MD  calcitonin, salmon, (MIACALCIN/FORTICAL) 200 UNIT/ACT nasal spray Place 1 spray into alternate nostrils daily.   Yes Historical Provider, MD  calcium-vitamin D (OSCAL WITH D) 500-200 MG-UNIT tablet Take 1 tablet by mouth 2 (two) times daily.   Yes Historical Provider, MD  HYDROcodone-acetaminophen (NORCO/VICODIN) 5-325 MG tablet Take 1 tablet by mouth every 8 (eight) hours as needed for moderate pain.   Yes Historical Provider, MD  Influenza vac split quadrivalent PF (FLUARIX) 0.5 ML injection Inject 0.5 mLs into the muscle once.   Yes Historical Provider, MD  magnesium hydroxide (MILK OF MAGNESIA) 400 MG/5ML suspension Take 30 mLs by mouth daily as needed for mild constipation.   Yes Historical Provider, MD  Sodium Phosphates (RA SALINE ENEMA RE) Place 1 each rectally as needed (for constipation).   Yes Historical Provider, MD  timolol (BETIMOL) 0.25 % ophthalmic solution Place 1-2 drops into both eyes 2 (two) times daily.    Yes Historical Provider, MD   Liver Function Tests No results for input(s): AST, ALT, ALKPHOS, BILITOT, PROT, ALBUMIN in the last 168 hours. No results for input(s): LIPASE, AMYLASE in the last 168 hours. CBC  Recent Labs Lab 08/26/15 1907  WBC 10.9*  NEUTROABS  7.3  HGB 14.8  HCT 45.2  MCV 88.5  PLT 194   Basic Metabolic Panel  Recent Labs Lab 08/26/15 1907  NA 134*  K 5.0  CL 99*  CO2 20*  GLUCOSE 150*  BUN 30*  CREATININE 1.42*  CALCIUM 9.3      Filed Vitals:   08/26/15 2100 08/26/15 2115 08/26/15 2130 08/26/15 2145  BP: 107/86 118/77 132/73 122/90  Pulse: 99 99 100 97  Resp: Height:    (1.575 m)   Weight:   70.308 kg (155 lb)   SpO2: 95% 94% 94% 97%   Exam: Blind elderly WF, no distress, looks a bit pale  and cyanotic Sclera anicteric, throat clear NO jvd Chest is clear bilat RRR slightly tachy, no MRG Abd soft ntnd no mass or hsm, no ascites GU deferred LE's slight increase size of L calf compared to R Neuro is pleasant, responsive, Ox 1  Na 134  K 5.0  CO2 20 BUN 30  Creat 1.42  WBC 10.9  Hb 14 plt 194 D-dimer 11.0   Assessment: 1. Hypoxemia - with reportedly normal CXR, high d-dimer, in an elderly, blind pt with mod-severe dementia. Plan is medical Rx with lovenox and get V/Q and LE dopplers in am.   2. DNR 3. Dementia 4. Renal insufficiency - looks a bit dry, will give IVF's.    Plan - as above    DVT Prophylaxis full dose AC w lovenox  Code Status: DNR  Family Communication: dtr at bedside  Disposition Plan: SNF    Maree Krabbe [facility] Pager [phone]  Cell [phone]  If 7PM-7AM, please contact night-coverage [email or web address] Password [email or web address] 08/26/2015, 10:51 PM

## 2015-08-26 NOTE — ED Notes (Signed)
From [employer]. C/O increasing SOB, chest xray done at facility and PA saw her as well. Lungs clear. Suspected PE. Called EMS to bring here. No hx of PE's, or obstructive lung diseases. Not on oxygen. Last vitals: 130/78, O2 96 2L, P 108.

## 2015-08-27 ENCOUNTER — Inpatient Hospital Stay (HOSPITAL_COMMUNITY): Payer: Medicare Other

## 2015-08-27 ENCOUNTER — Encounter (HOSPITAL_COMMUNITY): Payer: Self-pay | Admitting: General Practice

## 2015-08-27 DIAGNOSIS — R609 Edema, unspecified: Secondary | ICD-10-CM

## 2015-08-27 DIAGNOSIS — I82412 Acute embolism and thrombosis of left femoral vein: Secondary | ICD-10-CM

## 2015-08-27 DIAGNOSIS — I82402 Acute embolism and thrombosis of unspecified deep veins of left lower extremity: Secondary | ICD-10-CM | POA: Diagnosis present

## 2015-08-27 LAB — CBC
HCT: 41.2 % (ref 36.0–46.0)
HEMOGLOBIN: 13.3 g/dL (ref 12.0–15.0)
MCH: 28.9 pg (ref 26.0–34.0)
MCHC: 32.3 g/dL (ref 30.0–36.0)
MCV: 89.4 fL (ref 78.0–100.0)
Platelets: 170 10*3/uL (ref 150–400)
RBC: 4.61 MIL/uL (ref 3.87–5.11)
RDW: 14.7 % (ref 11.5–15.5)
WBC: 9.6 10*3/uL (ref 4.0–10.5)

## 2015-08-27 LAB — URINALYSIS, ROUTINE W REFLEX MICROSCOPIC
BILIRUBIN URINE: NEGATIVE
GLUCOSE, UA: NEGATIVE mg/dL
Hgb urine dipstick: NEGATIVE
KETONES UR: 15 mg/dL — AB
Nitrite: NEGATIVE
PH: 6 (ref 5.0–8.0)
Protein, ur: 30 mg/dL — AB
Specific Gravity, Urine: 1.014 (ref 1.005–1.030)
Urobilinogen, UA: 0.2 mg/dL (ref 0.0–1.0)

## 2015-08-27 LAB — BASIC METABOLIC PANEL
ANION GAP: 11 (ref 5–15)
BUN: 30 mg/dL — ABNORMAL HIGH (ref 6–20)
CHLORIDE: 105 mmol/L (ref 101–111)
CO2: 19 mmol/L — AB (ref 22–32)
Calcium: 8.7 mg/dL — ABNORMAL LOW (ref 8.9–10.3)
Creatinine, Ser: 1.18 mg/dL — ABNORMAL HIGH (ref 0.44–1.00)
GFR calc non Af Amer: 40 mL/min — ABNORMAL LOW (ref >60)
GFR, EST AFRICAN AMERICAN: 47 mL/min — AB (ref >60)
Glucose, Bld: 147 mg/dL — ABNORMAL HIGH (ref 65–99)
POTASSIUM: 4.4 mmol/L (ref 3.5–5.1)
Sodium: 135 mmol/L (ref 135–145)

## 2015-08-27 LAB — URINE MICROSCOPIC-ADD ON

## 2015-08-27 MED ORDER — TECHNETIUM TC 99M DIETHYLENETRIAME-PENTAACETIC ACID: 31.5000 | Freq: Once | INTRAVENOUS | Status: DC | PRN

## 2015-08-27 MED ORDER — DOCUSATE SODIUM 100 MG PO CAPS
100.0000 mg | ORAL_CAPSULE | Freq: Two times a day (BID) | ORAL | Status: DC
  Administered 2015-08-27 – 2015-08-28 (×4): 100 mg via ORAL
  Filled 2015-08-27 (×6): qty 1

## 2015-08-27 MED ORDER — CETYLPYRIDINIUM CHLORIDE 0.05 % MT LIQD
7.0000 mL | Freq: Two times a day (BID) | OROMUCOSAL | Status: DC
  Administered 2015-08-27 – 2015-08-28 (×3): 7 mL via OROMUCOSAL

## 2015-08-27 MED ORDER — DIPHENHYDRAMINE HCL 25 MG PO CAPS
50.0000 mg | ORAL_CAPSULE | Freq: Once | ORAL | Status: AC
  Administered 2015-08-27: 50 mg via ORAL
  Filled 2015-08-27: qty 2

## 2015-08-27 MED ORDER — TECHNETIUM TO 99M ALBUMIN AGGREGATED
4.2000 | Freq: Once | INTRAVENOUS | Status: AC | PRN
  Administered 2015-08-27: 4 via INTRAVENOUS

## 2015-08-27 MED ORDER — POLYETHYLENE GLYCOL 3350 17 G PO PACK
17.0000 g | PACK | Freq: Every day | ORAL | Status: DC
  Administered 2015-08-27 – 2015-08-29 (×3): 17 g via ORAL
  Filled 2015-08-27 (×3): qty 1

## 2015-08-27 MED ORDER — ZOLPIDEM TARTRATE 5 MG PO TABS: 5.0000 mg | ORAL_TABLET | Freq: Once | ORAL | Status: DC

## 2015-08-27 NOTE — Progress Notes (Signed)
[facility] PROGRESS NOTE  CUMA POLYAKOV ZOX:[id number] DOB: 1926-02-26 DOA: 08/26/2015  PCP: Deloris Ping, MD  Brief HPI: 81 year old Caucasian female with a past medical history of dementia, GERD, glaucoma, lives in a skilled nursing facility, but was going to transition to a different facility tomorrow. She comes in with shortness of breath and hypoxia. D-dimer was found to be elevated. She was hospitalized for further evaluation.  Past medical history:  Past Medical History  Diagnosis Date  . GERD (gastroesophageal reflux disease)   . Glaucoma   . Compression fracture of L2 (HCC) 07/2015  . Compression fracture of L3 lumbar vertebra (HCC) 07/2015  . Cognitive impairment   . Falls     "has fallen 4-5 times in the last 4 years" (08/27/2015)  . Dementia   . Renal insufficiency     [name] 08/26/2015  . Legally blind     "both eyes; from the glaucoma"  . Pulmonary emboli (HCC) 08/26/2015  . On home oxygen therapy     "2 1/2L; at rehab facility; intermittent; since yesterday" (08/26/2015)    Consultants: None  Procedures:  LE Venous Doppler Acute DVT in left lower extremity  Antibiotics: None  Subjective: Patient pleasantly confused. Denies any pain or shortness of breath currently. Her daughter-in-law is at the bedside.  Objective: Vital Signs  Filed Vitals:   08/26/15 2130 08/26/15 2145 08/26/15 2326 08/27/15 0446  BP: 132/73 122/90 107/63 111/70  Pulse: 100 97 110 102  Temp:   98.2 F (36.8 C) 98.6 F (37 C)  TempSrc:   Axillary Axillary  Resp: Height:  (1.575 m)   (1.575 m)   Weight: 70.308 kg (155 lb)  69.5 kg (153 lb 3.5 oz) 70.8 kg (156 lb 1.4 oz)  SpO2: 94% 97% 96% 93%    Intake/Output Summary (Last 24 hours) at 08/27/15 1004 Last data filed at 08/27/15 0700  Gross per 24 hour  Intake    700 ml  Output      0 ml  Net    700 ml   Filed Weights   08/26/15 2130 08/26/15 2326 08/27/15 0446  Weight: 70.308 kg  (155 lb) 69.5 kg (153 lb 3.5 oz) 70.8 kg (156 lb 1.4 oz)    General appearance: alert, cooperative, distracted and no distress Resp: clear to auscultation bilaterally Cardio: regular rate and rhythm, S1, S2 normal, no murmur, click, rub or gallop GI: soft, non-tender; bowel sounds normal; no masses,  no organomegaly Extremities: Mild swelling with calf tenderness bilaterally. No erythema. Neurologic: She is alert. Oriented to person alone. Distracted. No focal neurological deficits.  Lab Results:  Basic Metabolic Panel:  Recent Labs Lab 08/26/15 1907 08/27/15 0440  NA 134* 135  K 5.0 4.4  CL 99* 105  CO2 20* 19*  GLUCOSE 150* 147*  BUN 30* 30*  CREATININE 1.42* 1.18*  CALCIUM 9.3 8.7*   CBC:  Recent Labs Lab 08/26/15 1907 08/27/15 0440  WBC 10.9* 9.6  NEUTROABS 7.3  --   HGB 14.8 13.3  HCT 45.2 41.2  MCV 88.5 89.4  PLT 194 170    Studies/Results: Dg Chest 2 View  08/26/2015   CLINICAL DATA:  Shortness of breath tonight.  EXAM: CHEST  2 VIEW  COMPARISON:  None.  FINDINGS: Shallow inspiration. Heart size and pulmonary vascularity are normal. No focal airspace disease or consolidation in the lungs. No blunting of costophrenic angles. No pneumothorax. Moderate esophageal hiatal hernia behind the heart.  Calcified and tortuous aorta. Mediastinal contours appear intact. Degenerative changes in the spine and shoulders. Old fracture deformity of the right shoulder.  IMPRESSION: No evidence of active pulmonary disease. Esophageal hiatal hernia behind the heart.   Electronically Signed   By: Burman Nieves M.D.   On: 08/26/2015 23:08    Medications:  Scheduled: . antiseptic oral rinse  7 mL Mouth Rinse BID  . calcitonin (salmon)  1 spray Alternating Nares Daily  . calcium-vitamin D  1 tablet Oral BID  . docusate sodium  100 mg Oral BID  . enoxaparin (LOVENOX) injection  70 mg Subcutaneous Q24H  . latanoprost  1 drop Both Eyes QHS  . polyethylene glycol  17 g Oral Daily    . senna  1 tablet Oral BID  . timolol  1-2 drop Both Eyes BID  . zolpidem  5 mg Oral Once   Continuous: . sodium chloride 10 mL/hr at 08/27/15 1006   ZOX:[id number] **OR** acetaminophen, bisacodyl, ondansetron **OR** ondansetron (ZOFRAN) IV  Assessment/Plan:  Principal Problem:   Hypoxemia Active Problems:   Dementia without behavioral disturbance   D-dimer, elevated   Dyspnea   Blind    Dyspnea with hypoxemia Most likely acute pulmonary embolism considering that she has a DVT in the left lower extremity. VQ scan is pending. She is already on Lovenox. Will need to check room air saturations tomorrow.  Acute DVT left lower extremity Continue Lovenox. Anticipate transition to oral anticoagulation tomorrow.  History of dementia of unknown type Stable. Apparently she's had progressive dementia for the past several years. She was in a skilled nursing facility and plan was for her to move to a different facility. We will involve PT and OT. Social worker to follow as well.  History of glaucoma Continue with eyedrops.  DVT Prophylaxis: On full dose Lovenox    Code Status: DO NOT RESUSCITATE  Family Communication: Discussed with the daughter-in-law today  Disposition Plan: Await improvement. She was admitted from skilled nursing facility, but plan was for her to go to a different facility. Social worker to address.    LOS: 1 day   [facility]  [facility] Pager [phone] 08/27/2015, 10:04 AM  If 7PM-7AM, please contact night-coverage at [email or web address], password [facility]

## 2015-08-27 NOTE — Progress Notes (Signed)
Patient states she has the urgency to void. Patient placed on bedside commode. No urine output. She states having lower abdominal pain. Abdomin is soft and not distended, but having pain with pressure. Bladder scan showed 478 cc. MD notified. Will continue to monitor.  Valinda Hoar RN

## 2015-08-27 NOTE — Care Management Note (Signed)
Case Management Note  Patient Details  Name: MICKAELA STARLIN MRN: [id number] Date of Birth: 14-Jan-1926  Subjective/Objective:         Pt was admitted with hypoxemia.  Pt found to have mobile DVT.           Action/Plan:  Pt is from rehab portion of [facility].  Per pts daughter-in-law; Pts daughter had previously made arrangements for pt to transfer to [facility] prior to admit.  CM consulted CSW for assistance in placement post PT evaluation.   Expected Discharge Date:                  Expected Discharge Plan:  Skilled Nursing Facility  In-House Referral:  Clinical Social Work  Discharge planning Services  CM Consult  Post Acute Care Choice:    Choice offered to:     DME Arranged:    DME Agency:     HH Arranged:    HH Agency:     Status of Service:  In process, will continue to follow  Medicare Important Message Given:    Date Medicare IM Given:    Medicare IM give by:    Date Additional Medicare IM Given:    Additional Medicare Important Message give by:     If discussed at Long Length of Stay Meetings, dates discussed:    Additional Comments:  Cherylann Parr, RN 08/27/2015, 4:30 PM

## 2015-08-27 NOTE — Progress Notes (Signed)
Patient arrived from the ED with daughter beside her.  Patient is alert to self and place but not date or situation.  Vital signs are stable and patient states she is not in pain.  Patient is on nasal canula with 2 L of oxygen.  Patient is a High Fall risk and has been placed on a bed alarm.  Call bell at patient bedside.  Will continue to monitor patient. Harriet Masson, RN

## 2015-08-27 NOTE — Progress Notes (Signed)
In and out catheterization performed secondary to patient inability to void and complaints of lower abdominal discomfort. 500cc of concentrated yellow, malodorous urine obtained.  Sample sent for urinalysis.  Dr. Rito Ehrlich notified.

## 2015-08-27 NOTE — Progress Notes (Signed)
VASCULAR LAB PRELIMINARY  PRELIMINARY  PRELIMINARY  PRELIMINARY  Bilateral lower extremity venous duplex  completed.    Preliminary report:  Right:  No evidence of DVT, superficial thrombosis, or Baker's cyst.  Left: DVT noted in the distal CFV, Profunda v, popliteal v, PTV, and Pero v.  Thrombus is mobile in the CFV.  No evidence of superficial thrombosis.  No Baker's cyst.   Zayaan Kozak, RVT 08/27/2015, 11:42 AM

## 2015-08-27 NOTE — Progress Notes (Signed)
UR Completed. Ryleeann Urquiza, RN, BSN.  [phone] 

## 2015-08-28 DIAGNOSIS — R0902 Hypoxemia: Secondary | ICD-10-CM

## 2015-08-28 DIAGNOSIS — I82402 Acute embolism and thrombosis of unspecified deep veins of left lower extremity: Secondary | ICD-10-CM

## 2015-08-28 DIAGNOSIS — F0390 Unspecified dementia without behavioral disturbance: Secondary | ICD-10-CM

## 2015-08-28 DIAGNOSIS — I824Y2 Acute embolism and thrombosis of unspecified deep veins of left proximal lower extremity: Secondary | ICD-10-CM

## 2015-08-28 LAB — CBC
HEMATOCRIT: 40.1 % (ref 36.0–46.0)
HEMOGLOBIN: 12.5 g/dL (ref 12.0–15.0)
MCH: 28.2 pg (ref 26.0–34.0)
MCHC: 31.2 g/dL (ref 30.0–36.0)
MCV: 90.3 fL (ref 78.0–100.0)
Platelets: 206 10*3/uL (ref 150–400)
RBC: 4.44 MIL/uL (ref 3.87–5.11)
RDW: 14.7 % (ref 11.5–15.5)
WBC: 9.6 10*3/uL (ref 4.0–10.5)

## 2015-08-28 LAB — BASIC METABOLIC PANEL
Anion gap: 16 — ABNORMAL HIGH (ref 5–15)
BUN: 31 mg/dL — AB (ref 6–20)
CALCIUM: 9.1 mg/dL (ref 8.9–10.3)
CHLORIDE: 102 mmol/L (ref 101–111)
CO2: 17 mmol/L — AB (ref 22–32)
CREATININE: 1.15 mg/dL — AB (ref 0.44–1.00)
GFR calc non Af Amer: 42 mL/min — ABNORMAL LOW (ref >60)
GFR, EST AFRICAN AMERICAN: 48 mL/min — AB (ref >60)
Glucose, Bld: 130 mg/dL — ABNORMAL HIGH (ref 65–99)
Potassium: 4.6 mmol/L (ref 3.5–5.1)
Sodium: 135 mmol/L (ref 135–145)

## 2015-08-28 MED ORDER — APIXABAN 5 MG PO TABS: 5.0000 mg | ORAL_TABLET | Freq: Two times a day (BID) | ORAL | Status: DC

## 2015-08-28 MED ORDER — ENSURE ENLIVE PO LIQD
237.0000 mL | Freq: Two times a day (BID) | ORAL | Status: DC
  Administered 2015-08-29: 237 mL via ORAL

## 2015-08-28 MED ORDER — ENSURE ENLIVE PO LIQD: 237.0000 mL | Freq: Two times a day (BID) | ORAL | Status: DC

## 2015-08-28 MED ORDER — APIXABAN 5 MG PO TABS: 10.0000 mg | ORAL_TABLET | Freq: Two times a day (BID) | ORAL | Status: DC

## 2015-08-28 MED ORDER — APIXABAN 5 MG PO TABS
10.0000 mg | ORAL_TABLET | Freq: Two times a day (BID) | ORAL | Status: DC
  Administered 2015-08-28: 10 mg via ORAL
  Filled 2015-08-28: qty 2

## 2015-08-28 MED ORDER — APIXABAN 5 MG PO TABS
5.0000 mg | ORAL_TABLET | Freq: Two times a day (BID) | ORAL | Status: DC
Start: 2015-09-04 — End: 2015-08-28

## 2015-08-28 MED ORDER — APIXABAN 5 MG PO TABS
10.0000 mg | ORAL_TABLET | Freq: Two times a day (BID) | ORAL | Status: DC
  Administered 2015-08-28 – 2015-08-29 (×2): 10 mg via ORAL
  Filled 2015-08-28 (×3): qty 2

## 2015-08-28 NOTE — Evaluation (Signed)
Occupational Therapy Evaluation Patient Details Name: Kaitlyn Holloway MRN: [id number] DOB: 1925-11-19 Today's Date: 08/28/2015    History of Present Illness 81 year old Caucasian female with a past medical history of dementia, GERD, glaucoma, lives in a skilled nursing facility, but was going to transition to a different facility tomorrow. She comes in with shortness of breath and hypoxia   Clinical Impression   Pt admitted with SOB. Pt currently with functional limitations due to the deficits listed below (see OT Problem List).  Pt will benefit from skilled OT to increase their safety and independence with ADL and functional mobility for ADL to facilitate discharge to venue listed below.      Follow Up Recommendations  SNF;Other (comment) (or ALF)    Equipment Recommendations  None recommended by OT       Precautions / Restrictions Precautions Precautions: Fall      Mobility Bed Mobility Overal bed mobility: Needs Assistance Bed Mobility: Supine to Sit;Sit to Supine     Supine to sit: Max assist Sit to supine: Max assist      Transfers Overall transfer level: Needs assistance Equipment used: Rolling walker (2 wheeled) Transfers: Sit to/from Stand Sit to Stand: Mod assist                   ADL Overall ADL's : Needs assistance/impaired                         Toilet Transfer: Moderate assistance;RW Toilet Transfer Details (indicate cue type and reason): sit to stand           General ADL Comments: pt briefly stood EOB but refused further activity .  familty wants pt to go to ALF and not SNF.  ALF coming to evaluate pt               Pertinent Vitals/Pain Pain Assessment: No/denies pain     Hand Dominance     Extremity/Trunk Assessment Upper Extremity Assessment Upper Extremity Assessment: Generalized weakness           Communication Communication Communication: No difficulties   Cognition Arousal/Alertness:  Lethargic Behavior During Therapy: Agitated (pt did not want to stand or sit but did briefly with encouragement) Overall Cognitive Status: History of cognitive impairments - at baseline                                Home Living Family/patient expects to be discharged to:: Skilled nursing facility (ALF)                                        Prior Functioning/Environment Level of Independence: Needs assistance  Gait / Transfers Assistance Needed: transfers with min A, mod A with ADL 's          OT Diagnosis: Generalized weakness   OT Problem List: Decreased strength;Decreased activity tolerance;Decreased safety awareness;Impaired balance (sitting and/or standing)   OT Treatment/Interventions: Self-care/ADL training;DME and/or AE instruction;Patient/family education    OT Goals(Current goals can be found in the care plan section) Acute Rehab OT Goals Patient Stated Goal: go to ALF OT Goal Formulation: With family Time For Goal Achievement: 09/11/15 Potential to Achieve Goals: Good ADL Goals Pt Will Perform Grooming: with modified independence;standing Pt Will Transfer to Toilet: ambulating;with supervision Pt Will Perform Toileting - Clothing  Manipulation and hygiene: with supervision;sit to/from stand  OT Frequency: Min 2X/week   Barriers to D/C:               End of Session Nurse Communication: Mobility status  Activity Tolerance: Treatment limited secondary to agitation Patient left: in bed   Time: 1215-1234 OT Time Calculation (min): 19 min Charges:  OT General Charges $OT Visit: 1 Procedure OT Evaluation $Initial OT Evaluation Tier I: 1 Procedure G-Codes:    Alba CoryEDDING, Venkat Ankney D 08/28/2015, 12:45 PM

## 2015-08-28 NOTE — Progress Notes (Signed)
Pt did not void during shift except for small unmeasured amount with BM this am; MD was notified and he [facility] and bladder [facility]; [facility]; bladder scanned for 357ml; MD notified and he ordered for I&O cath; pt cathed for 550ml, urine had strong foul odor and cloudy. Will report off to incoming RN. Dionne BucyP. Amo Rulon Abdalla RN

## 2015-08-28 NOTE — Consult Note (Signed)
Vascular Surgery Consultation  Reason for Consult: Left common femoral DVT with pulmonary embolus  HPI: Kaitlyn Holloway is a 81 y.o. female who presents for evaluation of left common femoral vein DVT with pulmonary embolus. This 81 year old patient with dementia who recently moved into a nursing facility was noted to be short of breath and was placed on nasal oxygen. Her daughter requested further evaluation and she was admitted to the hospital with acute left common femoral vein DVT with intermediate likelihood of pulmonary embolus on VQ scan. Patient has had improvement in her symptomatology since in the hospital 48 hours ago. She has been placed on Lovenox initially and is currently on Eliquis. She has no previous history of DVT or pulmonary embolus. She denies any swelling in either lower extremity. She is not very mobile. She does walk occasionally with the help of a walker. She is accompanied by her son today. She is legally blind from glaucoma.   Past Medical History  Diagnosis Date  . GERD (gastroesophageal reflux disease)   . Glaucoma   . Compression fracture of L2 (HCC) 07/2015  . Compression fracture of L3 lumbar vertebra (HCC) 07/2015  . Cognitive impairment   . Falls     "has fallen 4-5 times in the last 4 years" (08/27/2015)  . Dementia   . Renal insufficiency     Kaitlyn Holloway 08/26/2015  . Legally blind     "both eyes; from the glaucoma"  . Pulmonary emboli (HCC) 08/26/2015  . On home oxygen therapy     "2 1/2L; at rehab facility; intermittent; since yesterday" (08/26/2015)   Past Surgical History  Procedure Laterality Date  . Laparoscopic cholecystectomy    . Vaginal hysterectomy    . Excisional hemorrhoidectomy    . Glaucoma surgery Right    Social History   Social History  . Marital Status: Widowed    Spouse Name: N/A  . Number of Children: N/A  . Years of Education: N/A   Social History Main Topics  . Smoking status: Never Smoker   . Smokeless tobacco: Never  Used  . Alcohol Use: No  . Drug Use: No  . Sexual Activity: No   Other Topics Concern  . None   Social History Narrative   History reviewed. No pertinent family history. Allergies  Allergen Reactions  . Penicillins Hives and Swelling  . Shellfish-Derived Products Hives  . Sulfa Antibiotics Hives   Prior to Admission medications   Medication Sig Start Date End Date Taking? Authorizing Provider  bimatoprost (LUMIGAN) 0.01 % SOLN Place 1 drop into both eyes at bedtime.    Yes Historical Provider, MD  bisacodyl (DULCOLAX) 10 MG suppository Place 10 mg rectally as needed for moderate constipation.   Yes Historical Provider, MD  calcitonin, salmon, (MIACALCIN/FORTICAL) 200 UNIT/ACT nasal spray Place 1 spray into alternate nostrils daily.   Yes Historical Provider, MD  calcium-vitamin D (OSCAL WITH D) 500-200 MG-UNIT tablet Take 1 tablet by mouth 2 (two) times daily.   Yes Historical Provider, MD  HYDROcodone-acetaminophen (NORCO/VICODIN) 5-325 MG tablet Take 1 tablet by mouth every 8 (eight) hours as needed for moderate pain.   Yes Historical Provider, MD  Influenza vac split quadrivalent PF (FLUARIX) 0.5 ML injection Inject 0.5 mLs into the muscle once.   Yes Historical Provider, MD  magnesium hydroxide (MILK OF MAGNESIA) 400 MG/5ML suspension Take 30 mLs by mouth daily as needed for mild constipation.   Yes Historical Provider, MD  Sodium Phosphates (RA SALINE ENEMA RE) Place  1 each rectally as needed (for constipation).   Yes Historical Provider, MD  timolol (BETIMOL) 0.25 % ophthalmic solution Place 1-2 drops into both eyes 2 (two) times daily.    Yes Historical Provider, MD     Positive ROS  All other systems have been reviewed and were otherwise negative with the exception of those mentioned in the HPI and as above.  Physical Exam: Filed Vitals:   08/28/15 1032  BP: 123/80  Pulse: 88  Temp:   Resp: 18    General: Alert, no acute distress-on nasal oxygen-appears slightly  pale HEENT: Normal for age Cardiovascular: Regular rate and rhythm. Carotid pulses 2+, no bruits audible Respiratory: Clear to auscultation. No cyanosis, no use of accessory musculature GI: No organomegaly, abdomen is soft and non-tender Skin: No lesions in the area of chief complaint Neurologic: Sensation intact distally Psychiatric: Patient is competent for consent with normal mood and affect Musculoskeletal: No obvious deformities Extremities: No obvious edema in either calf or thigh and no calf or thigh tenderness noted. 2+ dorsalis pedis pulses palpable bilaterally. No bluish discoloration of either lower extremities and no ulceration.    Imaging reviewed: Duplex scanning revealed acute DVT in common femoral vein with "mobile thrombus". VQ scan revealed intermediate probability pulmonary embolus   Assessment/Plan:  81 year old patient who is resident of nursing facility with progressive dementia over last few years. Patient very immobile. Patient developed acute DVT at some point and was admitted with probable pulmonary embolus 48 hours ago. She is on appropriate anticoagulation. Only question would be whether to insert IVC filter in light of "mobile thrombus" in left femoral vein. I discussed the situation at length with her son who does not want to be overly aggressive in her treatment. I did discuss the fact that she could have fatal pulmonary embolus if filter is not placed and also discussed potential complications of the filter and stated  that chronic anticoagulation would probably be satisfactory. Because of her elderly age, dementia, and frail situation he does not think she would be appropriately benefited by procedure. I agree with this conclusion so would recommend anticoagulation only.   Kaitlyn GipJames Elmo Rio, MD 08/28/2015 1:10 PM

## 2015-08-28 NOTE — Discharge Instructions (Signed)

## 2015-08-28 NOTE — Progress Notes (Signed)
Patient placed on bedpan with no success. Bladder scan done twice reads 0ml. Notified K. Schorr. Patient confused and unable to tell if she needs to void.

## 2015-08-28 NOTE — Clinical Social Work Note (Signed)
Clinical Social Work Assessment  Patient Details  Name: Kaitlyn Holloway MRN: [id number] Date of Birth: 1926-10-13  Date of referral:  08/28/15               Reason for consult:  Facility Placement                Permission sought to share information with:  Oceanographeracility Contact Representative Permission granted to share information::  Yes, Verbal Permission Granted  Name::     Gunnar Fusiaula  [facility]  Relationship::  daughter  Contact Information:     Housing/Transportation Living arrangements for the past 2 months:  [facility] Source of Information:  Adult Children Patient Interpreter Needed:  None Criminal Activity/Legal Involvement Pertinent to Current Situation/Hospitalization:  No - Comment as needed Significant Relationships:  Adult Children Lives with:  Facility Resident Do you feel safe going back to the place where you live?  Yes Need for family participation in patient care:  Yes (Comment) (decision making)  Care giving concerns:  None at this time- pt will DC to SNF vs ALF when ready for DC   Social Worker assessment / plan:  CSW spoke with pt dtr concerning plan for when pt is ready for DC  Employment status:  Retired Health and safety inspectornsurance information:  [employer] of [employer] PT Recommendations:  Not assessed at this time Information / Referral to community resources:  Skilled Nursing Facility  Patient/Family's Response to care:  Pt dtr is agreeable to whatever is recommended at time of DC.  Pt has been at [facility] for short term rehab with the plan to [facility] [place] when ready to leave rehab.  If pt needs continuing rehab then dtr would like her to return to [facility] but if pt is appropriate for ALF would like her to transition to ALF.  Patient/Family's Understanding of and Emotional Response to Diagnosis, Current Treatment, and Prognosis:  Pt dtr is very realistic about pt condition and understands that the pt may have poor  prognosis given current medical condition- no questions or concerns at this time but just wanting to wait and see more what the doctor says  Emotional Assessment Appearance:  Appears stated age Attitude/Demeanor/Rapport:  Unable to Assess Affect (typically observed):  Unable to Assess Orientation:  Fluctuating Orientation (Suspected and/or reported Sundowners) Alcohol / Substance use:  Not Applicable Psych involvement (Current and /or in the community):  No (Comment)  Discharge Needs  Concerns to be addressed:  Care Coordination Readmission within the last 30 days:  No Current discharge risk:  None Barriers to Discharge:  Continued Medical Work up   [employer], Krikor Willet M, LCSW 08/28/2015, 2:13 PM

## 2015-08-28 NOTE — Evaluation (Signed)
Physical Therapy Evaluation Patient Details Name: Stasia Cavalierauline W Zhao MRN: [id number] DOB: 11-24-1925 Today's Date: 08/28/2015   History of Present Illness  81 year old Caucasian female with a past medical history of dementia, GERD, glaucoma, lives in a skilled nursing facility, but was going to transition to a different facility tomorrow. Presents with SOB and hypoxia. Found to have + DVT LLE and possible PE.   Clinical Impression  Patient very lethargic during session and at first agreeable to participating in PT evaluation however once half way to EOB pt resisting therapist and pushing back into supine. Pt declining EOB and OOB today reporting feeling too tired. Per son, pt has been sleeping all day. Requires assist with ADLs and ambulation at baseline. Pt has been at [facility] re[facility] for the last couple weeks and planned to transition to ALF. Will follow acutely to assess functional mobility to determine appropriate disposition - [facility]F vs ALF.   Follow Up Recommendations SNF;Other (comment) (vs ALF pending progress.)    Equipment Recommendations  None recommended by PT    Recommendations for Other Services       Precautions / Restrictions Precautions Precautions: Fall Restrictions Weight Bearing Restrictions: No      Mobility  Bed Mobility Overal bed mobility: Needs Assistance Bed Mobility: Supine to Sit     Supine to sit: Max assist;HOB elevated Sit to supine: Max assist   General bed mobility comments: Pt initially assisting with getting to EOB requiring assist with BLEs, and Max A for trunk elevation but once half way up pt resisting therapist and pulling self back into supine.   Transfers Overall transfer level:  (Refused to get to EOB after agreeing to it and to stand.) Equipment used: Rolling walker (2 wheeled) Transfers: Sit to/from Stand Sit to Stand: Mod assist            Ambulation/Gait                Stairs            Wheelchair Mobility     Modified Rankin (Stroke Patients Only)       Balance                                             Pertinent Vitals/Pain Pain Assessment: No/denies pain    Home Living Family/patient expects to be discharged to:: Skilled nursing facility (or possible ALF.)                      Prior Function Level of Independence: Needs assistance   Gait / Transfers Assistance Needed: Per son, pt requires assist with transfers and ambulation with RW.   ADL's / Homemaking Assistance Needed: Assist with ADls.        Hand Dominance        Extremity/Trunk Assessment   Upper Extremity Assessment: Defer to OT evaluation           Lower Extremity Assessment: Generalized weakness         Communication   Communication: No difficulties  Cognition Arousal/Alertness: Lethargic Behavior During Therapy: WFL for tasks assessed/performed Overall Cognitive Status: History of cognitive impairments - at baseline                      General Comments      Exercises General Exercises - Lower Extremity Ankle  Circles/Pumps: Both;10 reps;Supine Quad Sets: Both;10 reps;Supine Heel Slides: Both;10 reps;Supine Hip Flexion/Marching: Both;10 reps;Supine      Assessment/Plan    PT Assessment Patient needs continued PT services  PT Diagnosis Generalized weakness   PT Problem List Decreased strength;Cardiopulmonary status limiting activity;Decreased cognition;Decreased balance;Decreased mobility  PT Treatment Interventions Balance training;Gait training;Therapeutic activities;Therapeutic exercise;Functional mobility training;Patient/family education   PT Goals (Current goals can be found in the Care Plan section) Acute Rehab PT Goals Patient Stated Goal: let me sleep PT Goal Formulation: With patient Time For Goal Achievement: 09/11/15 Potential to Achieve Goals: Fair    Frequency Min 3X/week   Barriers to discharge        Co-evaluation                End of Session Equipment Utilized During Treatment: Oxygen Activity Tolerance: Patient limited by lethargy (not cooperative.) Patient left: in bed;with call bell/phone within reach;with bed alarm set;with family/visitor present;with SCD's reapplied Nurse Communication: Mobility status         Time: 1610-9604 PT Time Calculation (min) (ACUTE ONLY): 15 min   Charges:   PT Evaluation $Initial PT Evaluation Tier I: 1 Procedure     PT G Codes:        Ixel Boehning A Asherah Lavoy 08/28/2015, 2:47 PM Mylo Red, PT, DPT [phone]

## 2015-08-28 NOTE — Progress Notes (Addendum)
[facility] PROGRESS NOTE  Kaitlyn Holloway W Aikens ZOX:[id number] DOB: 22-Mar-1926 DOA: 08/26/2015  PCP: Deloris PingYTER-BROWN,SHERRY M, MD  Brief HPI: 81 year old Caucasian female with a past medical history of dementia, GERD, glaucoma, lives in a skilled nursing facility, but was going to transition to a different facility tomorrow. She comes in with shortness of breath and hypoxia. D-dimer was found to be elevated. She was hospitalized for further evaluation.  Past medical history:  Past Medical History  Diagnosis Date  . GERD (gastroesophageal reflux disease)   . Glaucoma   . Compression fracture of L2 (HCC) 07/2015  . Compression fracture of L3 lumbar vertebra (HCC) 07/2015  . Cognitive impairment   . Falls     "has fallen 4-5 times in the last 4 years" (08/27/2015)  . Dementia   . Renal insufficiency     [name]/notes 08/26/2015  . Legally blind     "both eyes; from the glaucoma"  . Pulmonary emboli (HCC) 08/26/2015  . On home oxygen therapy     "2 1/2L; at rehab facility; intermittent; since yesterday" (08/26/2015)    Consultants: None  Procedures:  LE Venous Doppler Acute DVT in left lower extremity  Antibiotics: None  Subjective: Patient pleasantly confused. Denies any pain or shortness of breath currently. Her daughter-in-law is at the bedside.  Objective: Vital Signs  Filed Vitals:   08/27/15 1946 08/28/15 0500 08/28/15 0505 08/28/15 1032  BP: 109/66  132/73 123/80  Pulse: 98  94 88  Temp: 97.5 F (36.4 C)     TempSrc: Oral     Resp: 18  18 18   Height:      Weight:  70.2 kg (154 lb 12.2 oz)    SpO2: 96%  94% 98%    Intake/Output Summary (Last 24 hours) at 08/28/15 1238 Last data filed at 08/27/15 1415  Gross per 24 hour  Intake      0 ml  Output    500 ml  Net   -500 ml   Filed Weights   08/26/15 2326 08/27/15 0446 08/28/15 0500  Weight: 69.5 kg (153 lb 3.5 oz) 70.8 kg (156 lb 1.4 oz) 70.2 kg (154 lb 12.2 oz)    General appearance: alert, cooperative,  distracted and no distress Resp: clear to auscultation bilaterally Cardio: regular rate and rhythm, S1, S2 normal, no murmur, click, rub or gallop GI: soft, non-tender; bowel sounds normal; no masses,  no organomegaly Extremities: Mild swelling with calf tenderness bilaterally. No erythema. Neurologic: She is alert. Oriented to person alone. Distracted. No focal neurological deficits.  Lab Results:  Basic Metabolic Panel:  Recent Labs Lab 08/26/15 1907 08/27/15 0440 08/28/15 0432  NA 134* 135 135  K 5.0 4.4 4.6  CL 99* 105 102  CO2 20* 19* 17*  GLUCOSE 150* 147* 130*  BUN 30* 30* 31*  CREATININE 1.42* 1.18* 1.15*  CALCIUM 9.3 8.7* 9.1   CBC:  Recent Labs Lab 08/26/15 1907 08/27/15 0440 08/28/15 0432  WBC 10.9* 9.6 9.6  NEUTROABS 7.3  --   --   HGB 14.8 13.3 12.5  HCT 45.2 41.2 40.1  MCV 88.5 89.4 90.3  PLT 194 170 206    Studies/Results: Dg Chest 2 View  08/26/2015  CLINICAL DATA:  Shortness of breath tonight. EXAM: CHEST  2 VIEW COMPARISON:  None. FINDINGS: Shallow inspiration. Heart size and pulmonary vascularity are normal. No focal airspace disease or consolidation in the lungs. No blunting of costophrenic angles. No pneumothorax. Moderate esophageal hiatal hernia behind the heart.  Calcified and tortuous aorta. Mediastinal contours appear intact. Degenerative changes in the spine and shoulders. Old fracture deformity of the right shoulder. IMPRESSION: No evidence of active pulmonary disease. Esophageal hiatal hernia behind the heart. Electronically Signed   By: Burman Nieves M.D.   On: 08/26/2015 23:08   Nm Pulmonary Perf And Vent  08/27/2015  CLINICAL DATA:  81 year old with acute onset of shortness of breath and hypoxemia which began last night. Elevated D-dimer. Renal insufficiency (creatinine 1.42, estimated GFR 32) precluded IV contrast for CTA chest. EXAM: NUCLEAR MEDICINE VENTILATION - PERFUSION LUNG SCAN TECHNIQUE: Ventilation images were obtained in  multiple projections using inhaled aerosol Tc-16m DTPA. Perfusion images were obtained in multiple projections after intravenous injection of Tc-60m MAA. RADIOPHARMACEUTICALS:  40 mCi Technetium-24m DTPA aerosol inhalation and 6 mCi Technetium-96m MAA IV COMPARISON:  No prior nuclear imaging. AP semi-erect and lateral chest x-ray yesterday 2250 hr is correlated. FINDINGS: Ventilation: No focal ventilatory abnormality. Mild deposition of aerosol in the left upper lobe and right lower lobe airways accounting for focal hot spots. Perfusion: Solitary large segmental wedge-shaped perfusion defect involving the lateral left upper lobe. IMPRESSION: Intermediate (20-79%) probability of pulmonary embolism based on PIOPED II criteria. Electronically Signed   By: Hulan Saas M.D.   On: 08/27/2015 16:01    Medications:  Scheduled: . antiseptic oral rinse  7 mL Mouth Rinse BID  . apixaban  10 mg Oral Q12H  . [START ON 09/04/2015] apixaban  5 mg Oral BID  . calcitonin (salmon)  1 spray Alternating Nares Daily  . calcium-vitamin D  1 tablet Oral BID  . docusate sodium  100 mg Oral BID  . latanoprost  1 drop Both Eyes QHS  . polyethylene glycol  17 g Oral Daily  . senna  1 tablet Oral BID  . timolol  1-2 drop Both Eyes BID  . zolpidem  5 mg Oral Once   Continuous: . sodium chloride 10 mL/hr at 08/27/15 1006   YQM:[id number] **OR** acetaminophen, bisacodyl, ondansetron **OR** ondansetron (ZOFRAN) IV, technetium TC 66M diethylenetriame-pentaacetic acid  Assessment/Plan:  Principal Problem:   Acute deep vein thrombosis (DVT) of left lower extremity (HCC) Active Problems:   Dementia without behavioral disturbance   Hypoxemia   D-dimer, elevated   Dyspnea   Blind    Acute hypoxic respiratory failure - This is secondary to pulmonary embolism, as VQ scan significant for end intermediate risk for PE. - Continue with oxygen - Continue with anticoagulation  Acute VTE - With left lower  extremity acute DVT . - V/Q significant for intermediate risk for PE  - Transitioned from Lovenox to Eliquis - Denies any chest pain, no tachypnea, no tachycardia, blood pressure stable ,no clinical evidence of right ventricular strain - Requested vascular surgery input regarding finding of mobile clot in CFV .  History of dementia of unknown type Stable. Apparently she's had progressive dementia for the past several years. She was in a skilled nursing facility and plan was for her to move to a different facility. - seen by PT/OT  Urinary retention - Bladder scan as needed - Negative urinalysis  History of glaucoma Continue with eyedrops.  DVT Prophylaxis: Started on Eliquis Code Status: DO NOT RESUSCITATE  Family Communication: Son at bedside Disposition Plan: Awaiting PT evaluation    LOS: 2 days   [facility], Egidio Lofgren  [facility] Pager [phone]  08/28/2015, 12:38 PM  If 7PM-7AM, please contact night-coverage at [email or web address], password [facility]

## 2015-08-28 NOTE — Progress Notes (Signed)
ANTICOAGULATION CONSULT NOTE Pharmacy Consult for Lovenox to Eliquis Indication: PE and DVT  Allergies  Allergen Reactions  . Penicillins Hives and Swelling  . Shellfish-Derived Products Hives  . Sulfa Antibiotics Hives    Assessment: [age] yof presented to the ED with SOB found to have DVT and intermediate probability for PE Currently on Lovenox - last dose at 11 pm last night   Goal of Therapy:  Appropriate Eliquis dosing   Plan:  DC Lovenox Eliquis 10 mg po BID x 7 days then 5 mg po BID  Follow up BMET in AM  Thank you Okey RegalLisa Tesia Lybrand, PharmD [phone]  08/28/2015,10:49 AM

## 2015-08-29 DIAGNOSIS — J9600 Acute respiratory failure, unspecified whether with hypoxia or hypercapnia: Secondary | ICD-10-CM

## 2015-08-29 DIAGNOSIS — I2699 Other pulmonary embolism without acute cor pulmonale: Secondary | ICD-10-CM

## 2015-08-29 DIAGNOSIS — J9601 Acute respiratory failure with hypoxia: Secondary | ICD-10-CM

## 2015-08-29 DIAGNOSIS — R339 Retention of urine, unspecified: Secondary | ICD-10-CM

## 2015-08-29 LAB — URINE CULTURE

## 2015-08-29 MED ORDER — APIXABAN 5 MG PO TABS: ORAL_TABLET | ORAL | Status: AC

## 2015-08-29 MED ORDER — ENSURE ENLIVE PO LIQD: 237.0000 mL | Freq: Two times a day (BID) | ORAL | Status: AC

## 2015-08-29 NOTE — Discharge Summary (Addendum)
Kaitlyn Holloway, is a 81 y.o. female  DOB 1926/05/23  MRN [id number].  Admission date:  08/26/2015  Admitting Physician  Delano Metzobert Schertz, MD  Discharge Date:  08/29/2015   Primary MD  Deloris PingYTER-BROWN,SHERRY M, MD  Recommendations for primary care physician for things to follow:  - Please check CBC, BMP in 3 days. - Patient with urinary retention, Foley catheter was inserted during hospital stay, his attempt voiding trial in 3-5 days, or sooner if she is more ambulatory. - Patient to continue with oxygen 2 L nasal cannula secondary to diagnosis of PE. - Patient was started on anticoagulation Eliquis, giving her diagnosis of DVT and PE.    Admission Diagnosis  Hypoxemia [R09.02] Hypoxia [R09.02] Positive D dimer [R79.1] Elevated d-dimer [R79.1]   Discharge Diagnosis  Hypoxemia [R09.02] Hypoxia [R09.02] Positive D dimer [R79.1] Elevated d-dimer [R79.1]   Principal Problem:   Acute deep vein thrombosis (DVT) of left lower extremity (HCC) Active Problems:   Dementia without behavioral disturbance   Hypoxemia   D-dimer, elevated   Dyspnea   Blind   Acute respiratory failure (HCC)   Acute pulmonary embolism (HCC)   Urinary retention      Past Medical History  Diagnosis Date  . GERD (gastroesophageal reflux disease)   . Glaucoma   . Compression fracture of L2 (HCC) 07/2015  . Compression fracture of L3 lumbar vertebra (HCC) 07/2015  . Cognitive impairment   . Falls     "has fallen 4-5 times in the last 4 years" (08/27/2015)  . Dementia   . Renal insufficiency     [name]/notes 08/26/2015  . Legally blind     "both eyes; from the glaucoma"  . Pulmonary emboli (HCC) 08/26/2015  . On home oxygen therapy     "2 1/2L; at rehab facility; intermittent; since yesterday" (08/26/2015)    Past Surgical History  Procedure Laterality Date  . Laparoscopic cholecystectomy    . Vaginal hysterectomy    .  Excisional hemorrhoidectomy    . Glaucoma surgery Right        History of present illness and  Hospital Course:     Kindly see H&P for history of present illness and admission details, please review complete Labs, Consult reports and Test reports for all details in brief  HPI  from the history and physical done on the day of admission  Kaitlyn Holloway is a 81 y.o. 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 She has had progressive dementia for several years. Daughter provides history. She was living at home until a few weeks ago and she had to place her in a SNF/ rehab center. The daughter was visiting and saw they had put her on nasal oxygen. She had low SaO2 off the oxygen. So CXR was done at the SNF , reportedly normal. Patient sent here. Patient has no c/o's, but desat's in the low 80's off of oxygen. D-dimer high at 11.0. Asked to see patient for probable PE.   Daughter is primary  caretaker. She says no heroic measures, no dialysis or other invasive procedures. OK to treat conditions medically if non-invasive. DNR.   No cough, CP, no abd pain, no n/v/d. No joint pain, calf pain, no hx DVT or PE.    Hospital Course   Acute hypoxic respiratory failure - This is secondary to pulmonary embolism, as VQ scan significant for  intermediate risk for PE. - Continue with oxygen 2 L nasal count discharge. - Continue with anticoagulation  Acute VTE - With left lower extremity acute DVT . - V/Q significant for intermediate risk for PE  - Transitioned from Lovenox to Eliquis - Denies any chest pain, no tachypnea, no tachycardia, blood pressure stable ,no clinical evidence of right ventricular strain -  vascular surgery input regarding finding of mobile clot in CFV is greatly appreciated , at this point no recommendation for IVC filter .  History of dementia of unknown type Stable. Apparently she's had progressive dementia for the past several years. She was in a  skilled nursing facility and plan was for her to move to a different facility. - seen by PT/OT, condition is for SNF placement   Urinary retention - patient was noticed to have urinary retention, had multiples straight cath in and out for output , unfortunately patient could not void , foley catheter was inserted prior to  discharge , patient to have voiding trials at skilled nursing facility ,   History of glaucoma Continue with home medication   Discharge Condition:  Stable    Follow UP  Follow-up Information    Follow up with Deloris Ping, MD. Schedule an appointment as soon as possible for a visit on 09/10/2015.   Specialty:  Family Medicine   Why:  post hospitalization follow up/ pt app is on 09/10/2015 @10 :00   Contact information:   [address] [place] [address] [phone]         Discharge Instructions  and  Discharge Medications         Discharge Instructions    Diet - low sodium heart healthy    Complete by:  As directed      Discharge instructions    Complete by:  As directed   Follow with Primary MD Deloris Ping, MD in 7 days   Get CBC, CMP, 2 view Chest X ray checked  by Primary MD next visit.    Activity: As tolerated with Full fall precautions use walker/cane & assistance as needed   Disposition Home **   Diet: Heart Healthy , with feeding assistance and aspiration precautions.  For Heart failure patients - Check your Weight same time everyday, if you gain over 2 pounds, or you develop in leg swelling, experience more shortness of breath or chest pain, call your Primary MD immediately. Follow Cardiac Low Salt Diet and 1.5 lit/day fluid restriction.   On your next visit with your primary care physician please Get Medicines reviewed and adjusted.   Please request your Prim.MD to go over all Hospital Tests and Procedure/Radiological results at the follow up, please get all Hospital records sent to your  Prim MD by signing hospital release before you go home.   If you experience worsening of your admission symptoms, develop shortness of breath, life threatening emergency, suicidal or homicidal thoughts you must seek medical attention immediately by calling 911 or calling your MD immediately  if symptoms less severe.  You Must read complete instructions/literature along with all the possible adverse reactions/side effects for all  the Medicines you take and that have been prescribed to you. Take any new Medicines after you have completely understood and accpet all the possible adverse reactions/side effects.   Do not drive, operating heavy machinery, perform activities at heights, swimming or participation in water activities or provide baby sitting services if your were admitted for syncope or siezures until you have seen by Primary MD or a Neurologist and advised to do so again.  Do not drive when taking Pain medications.    Do not take more than prescribed Pain, Sleep and Anxiety Medications  Special Instructions: If you have smoked or chewed Tobacco  in the last 2 yrs please stop smoking, stop any regular Alcohol  and or any Recreational drug use.  Wear Seat belts while driving.   Please note  You were cared for by a hospitalist during your hospital stay. If you have any questions about your discharge medications or the care you received while you were in the hospital after you are discharged, you can call the unit and asked to speak with the hospitalist on call if the hospitalist that took care of you is not available. Once you are discharged, your primary care physician will handle any further medical issues. Please note that NO REFILLS for any discharge medications will be authorized once you are discharged, as it is imperative that you return to your primary care physician (or establish a relationship with a primary care physician if you do not have one) for your aftercare needs so that  they can reassess your need for medications and monitor your lab values.     Increase activity slowly    Complete by:  As directed             Medication List    STOP taking these medications        Influenza vac split quadrivalent PF 0.5 ML injection  Commonly known as:  FLUARIX      TAKE these medications        apixaban 5 MG Tabs tablet  Commonly known as:  ELIQUIS  Least take 2 tablets(10 mg) oral 2 times daily for next 5 days, then decrease to 1 table t(5 mg) oral  2 times daily from 09/04/2015.     bimatoprost 0.01 % Soln  Commonly known as:  LUMIGAN  Place 1 drop into both eyes at bedtime.     bisacodyl 10 MG suppository  Commonly known as:  DULCOLAX  Place 10 mg rectally as needed for moderate constipation.     calcitonin (salmon) 200 UNIT/ACT nasal spray  Commonly known as:  MIACALCIN/FORTICAL  Place 1 spray into alternate nostrils daily.     calcium-vitamin D 500-200 MG-UNIT tablet  Commonly known as:  OSCAL WITH D  Take 1 tablet by mouth 2 (two) times daily.     feeding supplement (ENSURE ENLIVE) Liqd  Take 237 mLs by mouth 2 (two) times daily between meals.     HYDROcodone-acetaminophen 5-325 MG tablet  Commonly known as:  NORCO/VICODIN  Take 1 tablet by mouth every 8 (eight) hours as needed for moderate pain.     magnesium hydroxide 400 MG/5ML suspension  Commonly known as:  MILK OF MAGNESIA  Take 30 mLs by mouth daily as needed for mild constipation.     RA SALINE ENEMA RE  Place 1 each rectally as needed (for constipation).     timolol 0.25 % ophthalmic solution  Commonly known as:  BETIMOL  Place 1-2 drops into both  eyes 2 (two) times daily.          Diet and Activity recommendation: See Discharge Instructions above   Consults obtained - Vascular surgery    Major procedures and Radiology Reports - PLEASE review detailed and final reports for all details, in brief -      Dg Chest 2 View  08/26/2015  CLINICAL DATA:  Shortness of  breath tonight. EXAM: CHEST  2 VIEW COMPARISON:  None. FINDINGS: Shallow inspiration. Heart size and pulmonary vascularity are normal. No focal airspace disease or consolidation in the lungs. No blunting of costophrenic angles. No pneumothorax. Moderate esophageal hiatal hernia behind the heart. Calcified and tortuous aorta. Mediastinal contours appear intact. Degenerative changes in the spine and shoulders. Old fracture deformity of the right shoulder. IMPRESSION: No evidence of active pulmonary disease. Esophageal hiatal hernia behind the heart. Electronically Signed   By: Burman Nieves M.D.   On: 08/26/2015 23:08   Nm Pulmonary Perf And Vent  08/27/2015  CLINICAL DATA:  81 year old with acute onset of shortness of breath and hypoxemia which began last night. Elevated D-dimer. Renal insufficiency (creatinine 1.42, estimated GFR 32) precluded IV contrast for CTA chest. EXAM: NUCLEAR MEDICINE VENTILATION - PERFUSION LUNG SCAN TECHNIQUE: Ventilation images were obtained in multiple projections using inhaled aerosol Tc-77m DTPA. Perfusion images were obtained in multiple projections after intravenous injection of Tc-12m MAA. RADIOPHARMACEUTICALS:  40 mCi Technetium-77m DTPA aerosol inhalation and 6 mCi Technetium-48m MAA IV COMPARISON:  No prior nuclear imaging. AP semi-erect and lateral chest x-ray yesterday 2250 hr is correlated. FINDINGS: Ventilation: No focal ventilatory abnormality. Mild deposition of aerosol in the left upper lobe and right lower lobe airways accounting for focal hot spots. Perfusion: Solitary large segmental wedge-shaped perfusion defect involving the lateral left upper lobe. IMPRESSION: Intermediate (20-79%) probability of pulmonary embolism based on PIOPED II criteria. Electronically Signed   By: Hulan Saas M.D.   On: 08/27/2015 16:01    Micro Results     Recent Results (from the past 240 hour(s))  Culture, Urine     Status: None   Collection Time: 08/27/15  2:15 PM    Result Value Ref Range Status   Specimen Description URINE, CATHETERIZED  Final   Special Requests NONE  Final   Culture >=100,000 COLONIES/mL KLEBSIELLA PNEUMONIAE  Final   Report Status 08/29/2015 FINAL  Final   Organism ID, Bacteria KLEBSIELLA PNEUMONIAE  Final      Susceptibility   Klebsiella pneumoniae - MIC*    AMPICILLIN 16 RESISTANT Resistant     CEFAZOLIN <=4 SENSITIVE Sensitive     CEFTRIAXONE <=1 SENSITIVE Sensitive     CIPROFLOXACIN <=0.25 SENSITIVE Sensitive     GENTAMICIN <=1 SENSITIVE Sensitive     IMIPENEM <=0.25 SENSITIVE Sensitive     NITROFURANTOIN 64 INTERMEDIATE Intermediate     TRIMETH/SULFA <=20 SENSITIVE Sensitive     AMPICILLIN/SULBACTAM 4 SENSITIVE Sensitive     PIP/TAZO <=4 SENSITIVE Sensitive     * >=100,000 COLONIES/mL KLEBSIELLA PNEUMONIAE       Today   Subjective:   Kaitlyn Holloway today has no headache,no chest or abdominal pain, was unable to void overnight .  Objective:   Blood pressure 106/68, pulse 85, temperature 98.2 F (36.8 C), temperature source Oral, resp. rate 18, height 5\' 2"  (1.575 m), weight 69.5 kg (153 lb 3.5 oz), SpO2 96 %.   Intake/Output Summary (Last 24 hours) at 08/29/15 1418 Last data filed at 08/29/15 1400  Gross per 24 hour  Intake  300 ml  Output    900 ml  Net   -600 ml    Exam  General appearance: alert, cooperative, distracted and no distress Resp: clear to auscultation bilaterally Cardio: regular rate and rhythm, S1, S2 normal, no murmur, click, rub or gallop GI: soft, non-tender; bowel sounds normal; no masses, no organomegaly Extremities: Mild swelling with calf tenderness bilaterally. No erythema. Neurologic: She is alert. Oriented to person alone. Distracted. No focal neurological deficits. Data Review   CBC w Diff:  Lab Results  Component Value Date   WBC 9.6 08/28/2015   HGB 12.5 08/28/2015   HCT 40.1 08/28/2015   PLT 206 08/28/2015   LYMPHOPCT 22 08/26/2015   MONOPCT 11 08/26/2015    EOSPCT 0 08/26/2015   BASOPCT 0 08/26/2015    CMP:  Lab Results  Component Value Date   NA 135 08/28/2015   K 4.6 08/28/2015   CL 102 08/28/2015   CO2 17* 08/28/2015   BUN 31* 08/28/2015   CREATININE 1.15* 08/28/2015  .   Total Time in preparing paper work, data evaluation and todays exam - 35 minutes  Maxfield Gildersleeve M.D on 08/29/2015 at 2:18 PM  [facility]   Office  [phone]

## 2015-08-29 NOTE — Progress Notes (Signed)
Pt has not voided and denies urge to void through out shift. Bladder scan shows 119 ml of urine. Will continue to monitor closely.  Sandrea HammondJunris Zen Cedillos RN

## 2015-08-29 NOTE — Progress Notes (Signed)
Called [facility] and gave report to RN. All questions answered. RN aware that pt has new foley catheter. Patient has been pulling at lines and removing oxygen.  Patient removed foley securement device.  I reapplied another device.  Patient continues to remove oxygen. Pt resting with call bell within reach.  Will continue to monitor. Pt transferring to facility via [facility]>. Thomas HoffBurton, Jaselle Pryer McClintock, RN

## 2015-08-29 NOTE — Progress Notes (Signed)
Inserted foley catheter due to acute retention. Pt resting with call bell within reach.  Will continue to monitor. Thomas HoffBurton, Lace Chenevert McClintock, RN

## 2015-08-29 NOTE — Progress Notes (Signed)
[facility] can accept pt back today if medically stable  CSW will continue to follow  Merlyn LotJenna Holoman, [facility] For PsychiatryCSWA Clinical Social Worker [phone]

## 2015-08-29 NOTE — Care Management Note (Signed)
Case Management Note  Patient Details  Name: Kaitlyn Holloway MRN: [id number] Date of Birth: Dec 15, 1925  Subjective/Objective:         Pt was admitted with hypoxemia.  Pt found to have mobile DVT.           Action/Plan:  Pt is from [facility]hab [facility].  Per pts daughter-in-law; Pts daughter had previously made arrangements for [facility] to [facility].  CM consulted CSW for assistance in placement post PT evaluation.   Expected Discharge Date:                  Expected Discharge Plan:  Skilled Nursing Facility  In-House Referral:  Clinical Social Work  Discharge planning Services  CM Consult  Post Acute Care Choice:    Choice offered to:     DME Arranged:    DME Agency:     HH Arranged:    HH Agency:     Status of Service:  Complete, will sign off  Medicare Important Message Given:  Yes-second notification given Date Medicare IM Given:    Medicare IM give by:    Date Additional Medicare IM Given:    Additional Medicare Important Message give by:     If discussed at Long Length of Stay Meetings, dates discussed:    Additional Comments: 08/29/2015 Pt will discharge to SNF today with aide of CSW Cherylann ParrClaxton, Leone Mobley S, RN 08/29/2015, 11:34 AM

## 2015-08-29 NOTE — Care Management Important Message (Signed)
Important Message  Patient Details  Name: Stasia Cavalierauline W Embree MRN: [id number] Date of Birth: 1926/07/29   Medicare Important Message Given:  Yes-second notification given    Kyla BalzarineShealy, Azaria Bartell Abena 08/29/2015, 10:41 AM

## 2015-08-30 ENCOUNTER — Encounter: Payer: Self-pay | Admitting: Internal Medicine

## 2015-08-30 ENCOUNTER — Non-Acute Institutional Stay (SKILLED_NURSING_FACILITY): Payer: Medicare Other | Admitting: Internal Medicine

## 2015-08-30 DIAGNOSIS — F0390 Unspecified dementia without behavioral disturbance: Secondary | ICD-10-CM | POA: Diagnosis not present

## 2015-08-30 DIAGNOSIS — I2699 Other pulmonary embolism without acute cor pulmonale: Secondary | ICD-10-CM

## 2015-08-30 DIAGNOSIS — H409 Unspecified glaucoma: Secondary | ICD-10-CM | POA: Diagnosis not present

## 2015-08-30 DIAGNOSIS — J9601 Acute respiratory failure with hypoxia: Secondary | ICD-10-CM | POA: Diagnosis not present

## 2015-08-30 DIAGNOSIS — K5900 Constipation, unspecified: Secondary | ICD-10-CM

## 2015-08-30 DIAGNOSIS — R339 Retention of urine, unspecified: Secondary | ICD-10-CM

## 2015-08-30 DIAGNOSIS — M810 Age-related osteoporosis without current pathological fracture: Secondary | ICD-10-CM | POA: Diagnosis not present

## 2015-08-30 DIAGNOSIS — K5909 Other constipation: Secondary | ICD-10-CM

## 2015-08-30 DIAGNOSIS — I82412 Acute embolism and thrombosis of left femoral vein: Secondary | ICD-10-CM | POA: Diagnosis not present

## 2015-08-30 NOTE — Progress Notes (Signed)
MRN: [id number] Name: Kaitlyn Holloway  Sex: female Age: 81 y.o. DOB: 02-11-1926  [facility] Level Of Care: SNF Provider: Merrilee SeashoreALEXANDER, Mccauley Diehl D Emergency Contacts: Extended Emergency Contact Information Primary Emergency Contact: Beverly MilchMindenhall,[place] Mobile Phone: [phone] Relation: Daughter  Code Status:   Allergies: Penicillins; Shellfish-derived products; and Sulfa antibiotics  Chief Complaint  Patient presents with  . New Admit To SNF    HPI: Patient is 81 y.o. female with hx of dementia, GERD, blind due to glaucoma/ MD , recently admitted to hospital for compression fx L2-3, who was noted at SNF to have a decline in O2 sats, low 80's, with no other sx, nl CXR  for 1+ days. PE was suspected. Pt was admitted to hospital from 10/10-13 where a PE was dx and tx with eliquis was begun. Course complicated by acute urinary retention requiring foley. Pt is admitted to SNF for supportive care. While at SNF pt will be followed for osteoporosis, tx withcalcitonin, calcium, Vit D, glaucoma, tx with lumigan and timolol and constipation tx with dulcolax.  Past Medical History  Diagnosis Date  . GERD (gastroesophageal reflux disease)   . Glaucoma   . Compression fracture of L2 (HCC) 07/2015  . Compression fracture of L3 lumbar vertebra (HCC) 07/2015  . Cognitive impairment   . Falls     "has fallen 4-5 times in the last 4 years" (08/27/2015)  . Dementia   . Renal insufficiency     [name]/notes 08/26/2015  . Legally blind     "both eyes; from the glaucoma"  . Pulmonary emboli (HCC) 08/26/2015  . On home oxygen therapy     "2 1/2L; at rehab facility; intermittent; since yesterday" (08/26/2015)    Past Surgical History  Procedure Laterality Date  . Laparoscopic cholecystectomy    . Vaginal hysterectomy    . Excisional hemorrhoidectomy    . Glaucoma surgery Right       Medication List       This list is accurate as of: 08/30/15 11:59  PM.  Always use your most recent med list.               apixaban 5 MG Tabs tablet  Commonly known as:  ELIQUIS  Least take 2 tablets(10 mg) oral 2 times daily for next 5 days, then decrease to 1 table t(5 mg) oral  2 times daily from 09/04/2015.     bimatoprost 0.01 % Soln  Commonly known as:  LUMIGAN  Place 1 drop into both eyes at bedtime.     bisacodyl 10 MG suppository  Commonly known as:  DULCOLAX  Place 10 mg rectally as needed for moderate constipation.     calcitonin (salmon) 200 UNIT/ACT nasal spray  Commonly known as:  MIACALCIN/FORTICAL  Place 1 spray into alternate nostrils daily.     calcium-vitamin D 500-200 MG-UNIT tablet  Commonly known as:  OSCAL WITH D  Take 1 tablet by mouth 2 (two) times daily.     feeding supplement (ENSURE ENLIVE) Liqd  Take 237 mLs by mouth 2 (two) times daily between meals.     HYDROcodone-acetaminophen 5-325 MG tablet  Commonly known as:  NORCO/VICODIN  Take 1 tablet by mouth every 8 (eight) hours as needed for moderate pain.     magnesium hydroxide 400 MG/5ML suspension  Commonly known as:  MILK OF MAGNESIA  Take 30 mLs by mouth daily as needed for mild constipation.     RA SALINE ENEMA RE  Place 1 each rectally as needed (for constipation).     timolol 0.25 % ophthalmic solution  Commonly known as:  BETIMOL  Place 1-2 drops into both eyes 2 (two) times daily.        No orders of the defined types were placed in this encounter.     There is no immunization history on file for this patient.  Social History  Substance Use Topics  . Smoking status: Never Smoker   . Smokeless tobacco: Never Used  . Alcohol Use: No    Family history is  UTO. Pt says no to all diseases   Review of Systems  DATA OBTAINED: from nurse GENERAL:  no fevers, fatigue, appetite changes SKIN: No itching, rash or wounds EYES: No eye pain, redness, discharge EARS: No earache, tinnitus, change in hearing NOSE: No congestion, drainage or  bleeding  MOUTH/THROAT: No mouth or tooth pain, No sore throat RESPIRATORY: No cough, wheezing, SOB CARDIAC: No chest pain, palpitations, lower extremity edema  GI: No abdominal pain, No N/V/D or constipation, No heartburn or reflux  GU: No dysuria, frequency or urgency, or incontinence  MUSCULOSKELETAL: No unrelieved bone/joint pain NEUROLOGIC: No headache, dizziness or focal weakness PSYCHIATRIC: No c/o anxiety or sadness   Filed Vitals:   08/30/15 1342  BP: 136/62  Pulse: 55  Temp: 98.2 F (36.8 C)  Resp: 18    SpO2 Readings from Last 1 Encounters:  08/29/15 96%        Physical Exam  GENERAL APPEARANCE: Alert, conversant,  No acute distress.  SKIN: No diaphoresis rash HEAD: Normocephalic, atraumatic  EYES: Conjunctiva/lids clear. Pupils round, reactive. EOMs intact.  EARS: External exam WNL, canals clear. Hearing grossly normal.  NOSE: No deformity or discharge.  MOUTH/THROAT: Lips w/o lesions  RESPIRATORY: Breathing is even, unlabored. Lung sounds are clear   CARDIOVASCULAR: Heart RRR no murmurs, rubs or gallops. No peripheral edema.   GASTROINTESTINAL: Abdomen is soft, non-tender, not distended w/ normal bowel sounds. GENITOURINARY: Bladder non tender, not distended  MUSCULOSKELETAL: No abnormal joints or musculature NEUROLOGIC:  Cranial nerves 2-12 grossly intact. Moves all extremities  PSYCHIATRIC: Mood and affect appropriate to situation with dementia, no behavioral issues  Patient Active Problem List   Diagnosis Date Noted  . Constipation, chronic 08/31/2015  . Acute respiratory failure (HCC) 08/29/2015  . Acute pulmonary embolism (HCC) 08/29/2015  . Urinary retention 08/29/2015  . Acute deep vein thrombosis (DVT) of left lower extremity (HCC) 08/27/2015  . Hypoxemia 08/26/2015  . D-dimer, elevated 08/26/2015  . Dyspnea 08/26/2015  . Blind 08/26/2015  . Glaucoma 08/18/2015  . Osteoporosis 08/18/2015  . GERD (gastroesophageal reflux disease)   .  Compression fracture of L2 (HCC)   . Compression fracture of L3 lumbar vertebra (HCC)   . Dementia without behavioral disturbance   . Falls     CBC    Component Value Date/Time   WBC 9.6 08/28/2015 0432   RBC 4.44 08/28/2015 0432   HGB 12.5 08/28/2015 0432   HCT 40.1 08/28/2015 0432   PLT 206 08/28/2015 0432   MCV 90.3 08/28/2015 0432   LYMPHSABS 2.4 08/26/2015 1907   MONOABS 1.2* 08/26/2015 1907   EOSABS 0.0 08/26/2015 1907   BASOSABS 0.0 08/26/2015 1907    CMP     Component Value Date/Time   NA 135 08/28/2015 0432   K 4.6 08/28/2015 0432   CL 102 08/28/2015 0432   CO2 17* 08/28/2015 0432   GLUCOSE 130* 08/28/2015 0432   BUN 31* 08/28/2015  0432   CREATININE 1.15* 08/28/2015 0432   CALCIUM 9.1 08/28/2015 0432   GFRNONAA 42* 08/28/2015 0432   GFRAA 48* 08/28/2015 0432    No results found for: HGBA1C   Dg Chest 2 View  08/26/2015  CLINICAL DATA:  Shortness of breath tonight. EXAM: CHEST  2 VIEW COMPARISON:  None. FINDINGS: Shallow inspiration. Heart size and pulmonary vascularity are normal. No focal airspace disease or consolidation in the lungs. No blunting of costophrenic angles. No pneumothorax. Moderate esophageal hiatal hernia behind the heart. Calcified and tortuous aorta. Mediastinal contours appear intact. Degenerative changes in the spine and shoulders. Old fracture deformity of the right shoulder. IMPRESSION: No evidence of active pulmonary disease. Esophageal hiatal hernia behind the heart. Electronically Signed   By: Burman Nieves M.D.   On: 08/26/2015 23:08   Nm Pulmonary Perf And Vent  08/27/2015  CLINICAL DATA:  81 year old with acute onset of shortness of breath and hypoxemia which began last night. Elevated D-dimer. Renal insufficiency (creatinine 1.42, estimated GFR 32) precluded IV contrast for CTA chest. EXAM: NUCLEAR MEDICINE VENTILATION - PERFUSION LUNG SCAN TECHNIQUE: Ventilation images were obtained in multiple projections using inhaled aerosol  Tc-52m DTPA. Perfusion images were obtained in multiple projections after intravenous injection of Tc-34m MAA. RADIOPHARMACEUTICALS:  40 mCi Technetium-54m DTPA aerosol inhalation and 6 mCi Technetium-68m MAA IV COMPARISON:  No prior nuclear imaging. AP semi-erect and lateral chest x-ray yesterday 2250 hr is correlated. FINDINGS: Ventilation: No focal ventilatory abnormality. Mild deposition of aerosol in the left upper lobe and right lower lobe airways accounting for focal hot spots. Perfusion: Solitary large segmental wedge-shaped perfusion defect involving the lateral left upper lobe. IMPRESSION: Intermediate (20-79%) probability of pulmonary embolism based on PIOPED II criteria. Electronically Signed   By: Hulan Saas M.D.   On: 08/27/2015 16:01    Not all labs, radiology exams or other studies done during hospitalization come through on my EPIC note; however they are reviewed by me.    Assessment and Plan  Acute respiratory failure (HCC) This is secondary to pulmonary embolism, as VQ scan significant for intermediate risk for PE. -SNF - Continue with oxygen 2 L ;Continue with eliquis  Acute deep vein thrombosis (DVT) of left lower extremity (HCC) With left lower extremity acute DVT . - V/Q significant for intermediate risk for PE  - Transitioned from Lovenox to Eliquis - Denies any chest pain, no tachypnea, no tachycardia, blood pressure stable ,no clinical evidence of right ventricular strain - vascular surgery input regarding finding of mobile clot in CFV is greatly appreciated , at this point no recommendation for IVC filter SNF - cont eliquis .   Acute pulmonary embolism (HCC) Findings of L DVT and intermediate VQ, hypoxia; SNF - cont eliquis  Urinary retention patient was noticed to have urinary retention, had multiples straight cath in and out for output , unfortunately patient could not void , foley catheter was inserted prior to discharge ; SNF -patient to have voiding  trials     Dementia without behavioral disturbance SNF -Chronic, stable, cony supportive care in SNF  Osteoporosis SNF - cont oscal and calcitonin  Glaucoma Pt with blindness; SNF - cont timolol and lumigan drops  Constipation, chronic SNF - cont dulcolax   Time spent 45 min; > 50% of time with patient was spent reviewing records, labs, tests and studies, counseling and developing plan of care  Margit Hanks, MD

## 2015-08-31 DIAGNOSIS — K5909 Other constipation: Secondary | ICD-10-CM | POA: Insufficient documentation

## 2015-08-31 NOTE — Assessment & Plan Note (Signed)
SNF -Chronic, stable, cony supportive care in SNF

## 2015-08-31 NOTE — Assessment & Plan Note (Signed)
SNF - cont oscal and calcitonin

## 2015-08-31 NOTE — Assessment & Plan Note (Signed)
Findings of L DVT and intermediate VQ, hypoxia; SNF - cont eliquis

## 2015-08-31 NOTE — Assessment & Plan Note (Signed)
SNF - cont dulcolax

## 2015-08-31 NOTE — Assessment & Plan Note (Signed)
With left lower extremity acute DVT . - V/Q significant for intermediate risk for PE  - Transitioned from Lovenox to Eliquis - Denies any chest pain, no tachypnea, no tachycardia, blood pressure stable ,no clinical evidence of right ventricular strain - vascular surgery input regarding finding of mobile clot in CFV is greatly appreciated , at this point no recommendation for IVC filter SNF - cont eliquis .

## 2015-08-31 NOTE — Assessment & Plan Note (Signed)
Pt with blindness; SNF - cont timolol and lumigan drops

## 2015-08-31 NOTE — Assessment & Plan Note (Addendum)
patient was noticed to have urinary retention, had multiples straight cath in and out for output , unfortunately patient could not void , foley catheter was inserted prior to discharge ; SNF -patient to have voiding trials , BMP to follow Cr

## 2015-08-31 NOTE — Assessment & Plan Note (Signed)
This is secondary to pulmonary embolism, as VQ scan significant for intermediate risk for PE. -SNF - Continue with oxygen 2 L ;Continue with eliquis

## 2015-09-17 ENCOUNTER — Non-Acute Institutional Stay (SKILLED_NURSING_FACILITY): Payer: Medicare Other | Admitting: Internal Medicine

## 2015-09-17 ENCOUNTER — Encounter: Payer: Self-pay | Admitting: Internal Medicine

## 2015-09-17 DIAGNOSIS — I82412 Acute embolism and thrombosis of left femoral vein: Secondary | ICD-10-CM

## 2015-09-17 DIAGNOSIS — H409 Unspecified glaucoma: Secondary | ICD-10-CM

## 2015-09-17 DIAGNOSIS — S32030S Wedge compression fracture of third lumbar vertebra, sequela: Secondary | ICD-10-CM

## 2015-09-17 DIAGNOSIS — K219 Gastro-esophageal reflux disease without esophagitis: Secondary | ICD-10-CM

## 2015-09-17 DIAGNOSIS — R339 Retention of urine, unspecified: Secondary | ICD-10-CM

## 2015-09-17 DIAGNOSIS — H547 Unspecified visual loss: Secondary | ICD-10-CM

## 2015-09-17 DIAGNOSIS — J9601 Acute respiratory failure with hypoxia: Secondary | ICD-10-CM

## 2015-09-17 DIAGNOSIS — F0390 Unspecified dementia without behavioral disturbance: Secondary | ICD-10-CM

## 2015-09-17 DIAGNOSIS — S32020S Wedge compression fracture of second lumbar vertebra, sequela: Secondary | ICD-10-CM

## 2015-09-17 DIAGNOSIS — I2699 Other pulmonary embolism without acute cor pulmonale: Secondary | ICD-10-CM

## 2015-09-17 DIAGNOSIS — H540 Blindness, both eyes: Secondary | ICD-10-CM

## 2015-09-17 NOTE — Progress Notes (Signed)
MRN: [id number] Name: Kaitlyn Holloway  Sex: female Age: 81 y.o. DOB: 10-14-1926  [facility] Facility/Room:104 Level Of Care: SNF Provider: Merrilee Seashore D Emergency Contacts: Extended Emergency Contact Information Primary Emergency Contact: Beverly Milch [place] Mobile Phone: [phone] Relation: Daughter  Code Status: DNR  Allergies: Penicillins; Shellfish-derived products; and Sulfa antibiotics  Chief Complaint  Patient presents with  . Discharge Note    HPI: Patient is 81 y.o. female with hx of dementia, GERD, blind due to glaucoma/ MD , recently admitted to hospital for compression fx L2-3, who was noted at SNF to have a decline in O2 sats, low 80's, with no other sx, nl CXR for 1+ days. PE was suspected. Pt was admitted to hospital from 10/10-13 where a PE was dx and tx with eliquis was begun. Course complicated by acute urinary retention requiring foley. Pt was admitted to SNF for supportive care. Pt is now ready to be d/c to home. Pt has had uneventful course since hospitalization for her PE. Urinary retention has resolved.  Past Medical History  Diagnosis Date  . GERD (gastroesophageal reflux disease)   . Glaucoma   . Compression fracture of L2 (HCC) 07/2015  . Compression fracture of L3 lumbar vertebra (HCC) 07/2015  . Cognitive impairment   . Falls     "has fallen 4-5 times in the last 4 years" (08/27/2015)  . Dementia   . Renal insufficiency     [name] 08/26/2015  . Legally blind     "both eyes; from the glaucoma"  . Pulmonary emboli (HCC) 08/26/2015  . On home oxygen therapy     "2 1/2L; at rehab facility; intermittent; since yesterday" (08/26/2015)    Past Surgical History  Procedure Laterality Date  . Laparoscopic cholecystectomy    . Vaginal hysterectomy    . Excisional hemorrhoidectomy    . Glaucoma surgery Right       Medication List       This list is accurate as of: 09/17/15  4:16 PM.  Always use your most  recent med list.               apixaban 5 MG Tabs tablet  Commonly known as:  ELIQUIS  Least take 2 tablets(10 mg) oral 2 times daily for next 5 days, then decrease to 1 table t(5 mg) oral  2 times daily from 09/04/2015.     bimatoprost 0.01 % Soln  Commonly known as:  LUMIGAN  Place 1 drop into both eyes at bedtime.     bisacodyl 10 MG suppository  Commonly known as:  DULCOLAX  Place 10 mg rectally as needed for moderate constipation.     calcitonin (salmon) 200 UNIT/ACT nasal spray  Commonly known as:  MIACALCIN/FORTICAL  Place 1 spray into alternate nostrils daily.     calcium-vitamin D 500-200 MG-UNIT tablet  Commonly known as:  OSCAL WITH D  Take 1 tablet by mouth 2 (two) times daily.     feeding supplement (ENSURE ENLIVE) Liqd  Take 237 mLs by mouth 2 (two) times daily between meals.     HYDROcodone-acetaminophen 5-325 MG tablet  Commonly known as:  NORCO/VICODIN  Take 1 tablet by mouth every 8 (eight) hours as needed for moderate pain.     magnesium hydroxide 400 MG/5ML suspension  Commonly known as:  MILK OF MAGNESIA  Take 30 mLs by mouth daily as needed for mild constipation.     RA SALINE ENEMA RE  Place 1 each  rectally as needed (for constipation).     timolol 0.25 % ophthalmic solution  Commonly known as:  BETIMOL  Place 1-2 drops into both eyes 2 (two) times daily.        No orders of the defined types were placed in this encounter.     There is no immunization history on file for this patient.  Social History  Substance Use Topics  . Smoking status: Never Smoker   . Smokeless tobacco: Never Used  . Alcohol Use: No    Filed Vitals:   09/17/15 1608  BP: 119/71  Pulse: 91  Temp: 98.1 F (36.7 C)  Resp: 18    Physical Exam  GENERAL APPEARANCE: Alert, conversant. No acute distress.  HEENT: Unremarkable. RESPIRATORY: Breathing is even, unlabored. Lung sounds are clear   CARDIOVASCULAR: Heart RRR no murmurs, rubs or gallops. No  peripheral edema.  GASTROINTESTINAL: Abdomen is soft, non-tender, not distended w/ normal bowel sounds.  NEUROLOGIC: Cranial nerves 2-12 grossly intact. Moves all extremities  Patient Active Problem List   Diagnosis Date Noted  . Constipation, chronic 08/31/2015  . Acute respiratory failure (HCC) 08/29/2015  . Acute pulmonary embolism (HCC) 08/29/2015  . Urinary retention 08/29/2015  . Acute deep vein thrombosis (DVT) of left lower extremity (HCC) 08/27/2015  . Hypoxemia 08/26/2015  . D-dimer, elevated 08/26/2015  . Dyspnea 08/26/2015  . Blind 08/26/2015  . Glaucoma 08/18/2015  . Osteoporosis 08/18/2015  . GERD (gastroesophageal reflux disease)   . Compression fracture of L2 (HCC)   . Compression fracture of L3 lumbar vertebra (HCC)   . Dementia without behavioral disturbance   . Falls     CBC    Component Value Date/Time   WBC 9.6 08/28/2015 0432   RBC 4.44 08/28/2015 0432   HGB 12.5 08/28/2015 0432   HCT 40.1 08/28/2015 0432   PLT 206 08/28/2015 0432   MCV 90.3 08/28/2015 0432   LYMPHSABS 2.4 08/26/2015 1907   MONOABS 1.2* 08/26/2015 1907   EOSABS 0.0 08/26/2015 1907   BASOSABS 0.0 08/26/2015 1907    CMP     Component Value Date/Time   NA 135 08/28/2015 0432   K 4.6 08/28/2015 0432   CL 102 08/28/2015 0432   CO2 17* 08/28/2015 0432   GLUCOSE 130* 08/28/2015 0432   BUN 31* 08/28/2015 0432   CREATININE 1.15* 08/28/2015 0432   CALCIUM 9.1 08/28/2015 0432   GFRNONAA 42* 08/28/2015 0432   GFRAA 48* 08/28/2015 0432    Assessment and Plan  Pt is d/c to ALF. DME s have been ordered. Rx's have been written.  Time spent > 35;> 50% of time with patient was spent reviewing records, labs, tests and studies, counseling and developing plan of care  Margit HanksALEXANDER, ANNE D, MD

## 2016-11-19 IMAGING — CR DG CHEST 2V
2 series · 2 of 2 positions shown · non-contrast
Comparison: None.

CLINICAL DATA: Shortness of breath tonight.

EXAM:
CHEST  2 VIEW

[chest lat]
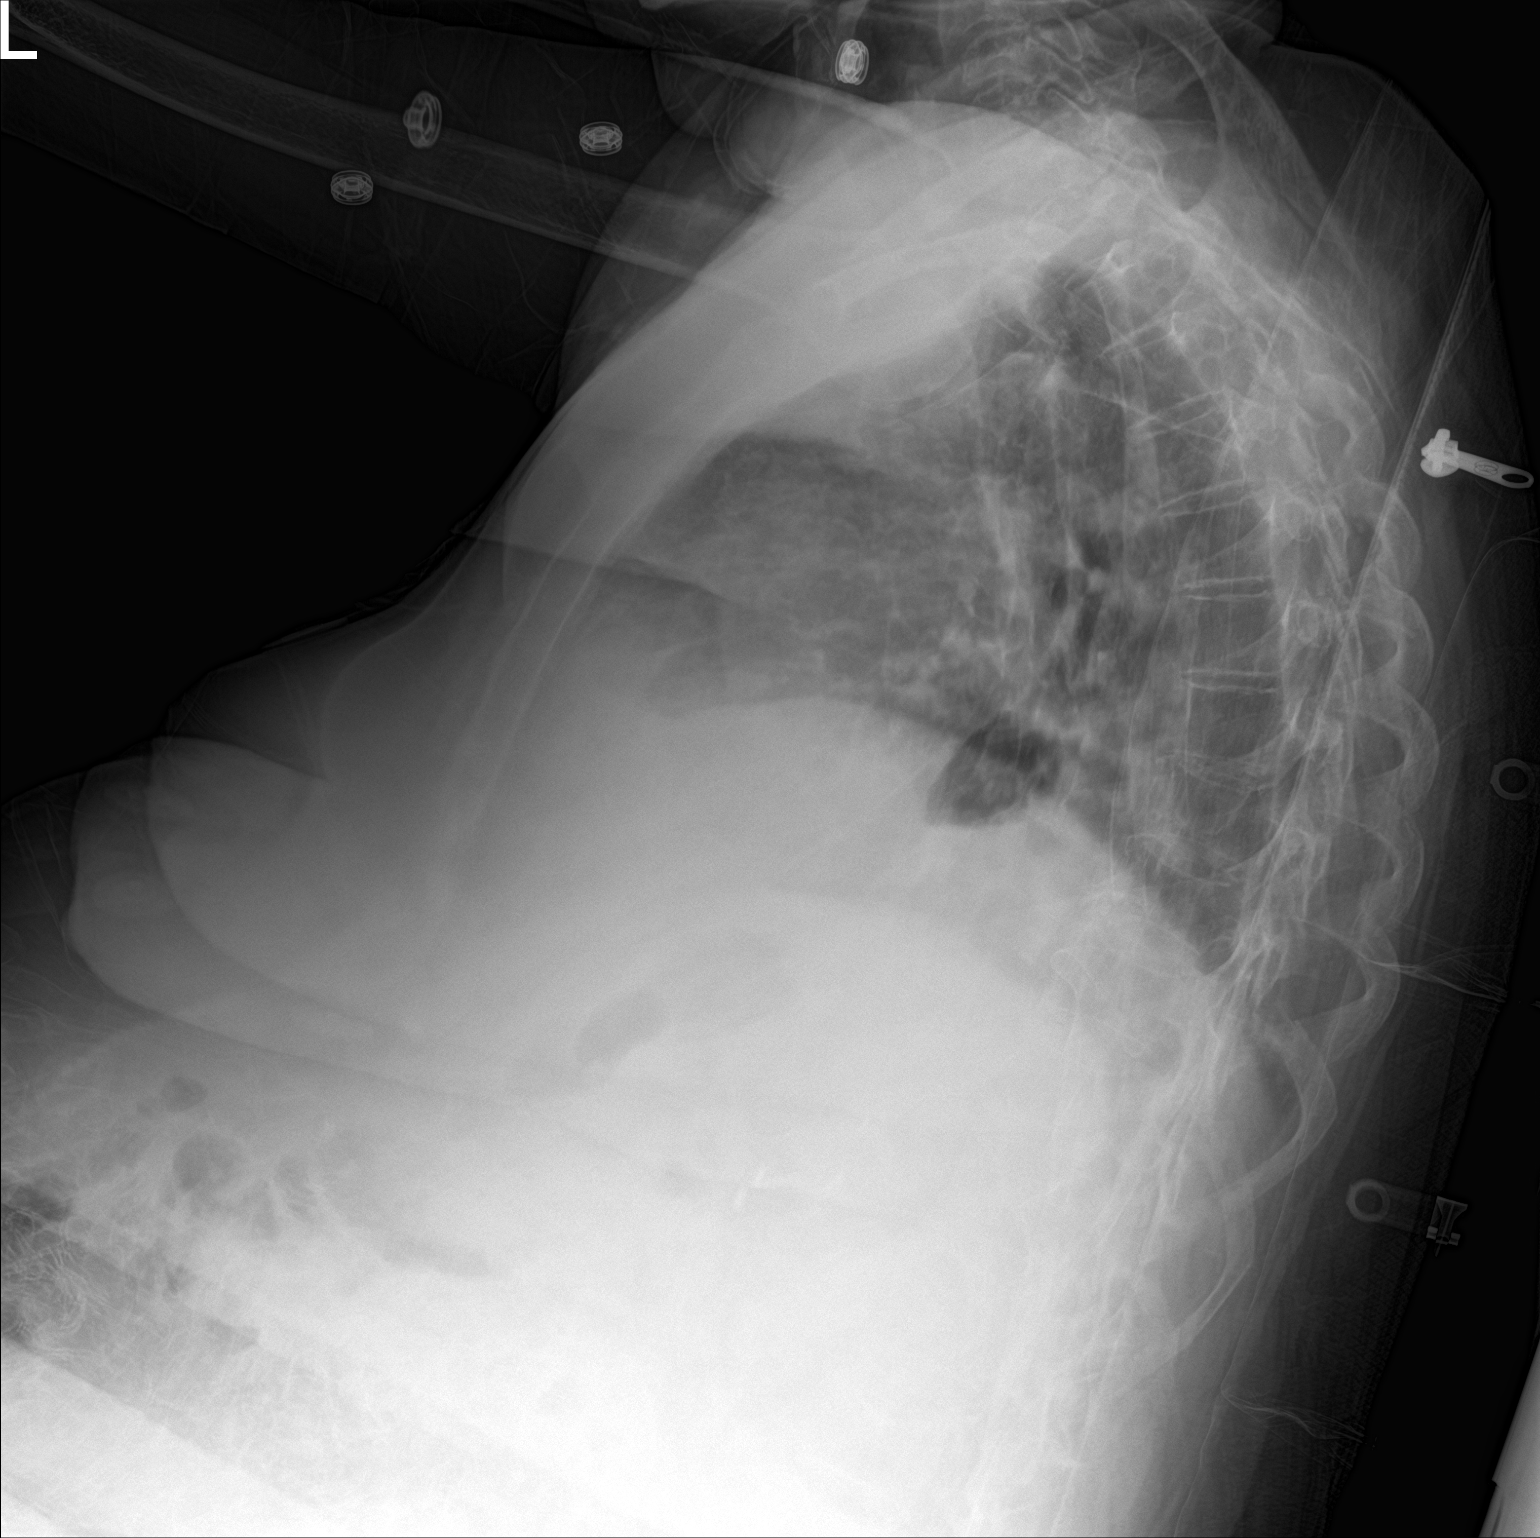

[chest ap]
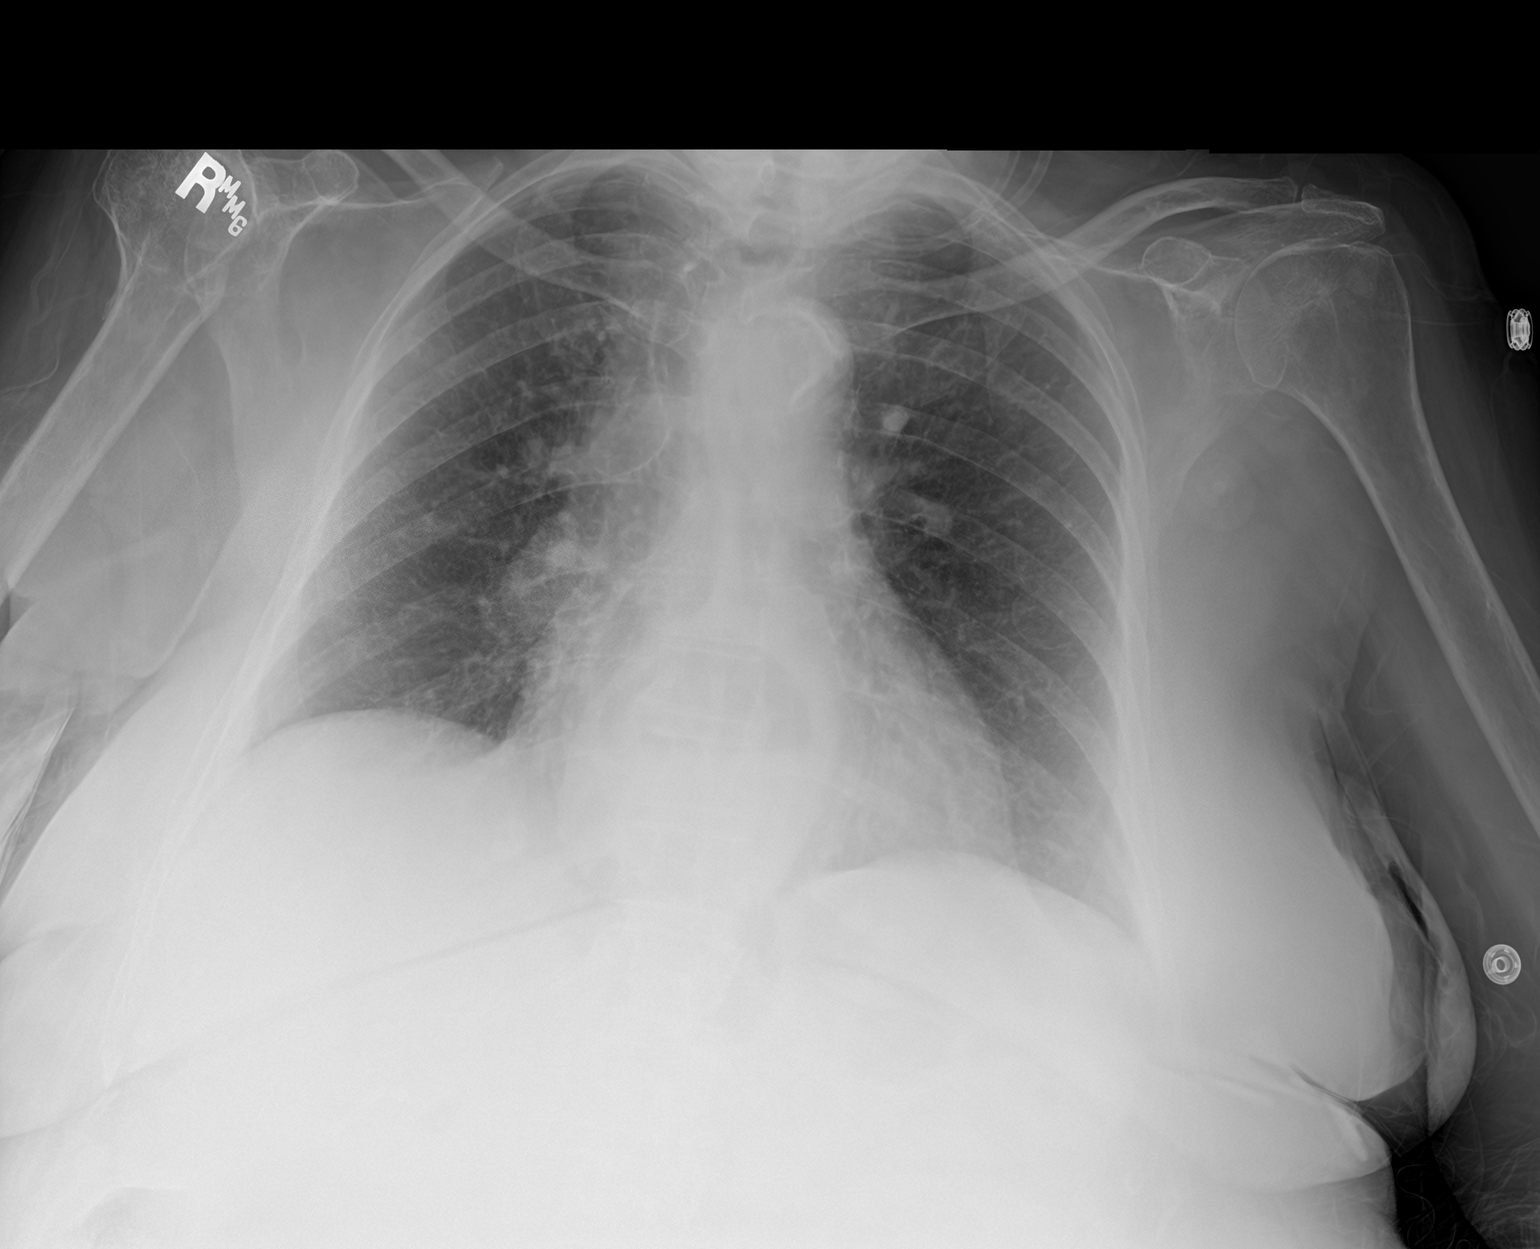

[2 of 2 positions shown; findings below may reference images not displayed]

FINDINGS: Shallow inspiration. Heart size and pulmonary vascularity are
normal. No focal airspace disease or consolidation in the lungs. No
blunting of costophrenic angles. No pneumothorax. Moderate
esophageal hiatal hernia behind the heart. Calcified and tortuous
aorta. Mediastinal contours appear intact. Degenerative changes in
the spine and shoulders. Old fracture deformity of the right
shoulder.
IMPRESSION: No evidence of active pulmonary disease. Esophageal hiatal hernia
behind the heart.

## 2016-11-20 IMAGING — NM NM PULMONARY VENT & PERF
16 series · 16 of 16 positions shown · non-contrast
Comparison: No prior nuclear imaging. AP semi-erect and lateral
chest x-ray yesterday 1170 hr is correlated.

CLINICAL DATA: 89-year-old with acute onset of shortness of breath
and hypoxemia which began last night. Elevated D-dimer. Renal
contrast for CTA chest.

EXAM:
NUCLEAR MEDICINE VENTILATION - PERFUSION LUNG SCAN
TECHNIQUE: Ventilation images were obtained in multiple projections using
inhaled aerosol Yc-66m DTPA. Perfusion images were obtained in
multiple projections after intravenous injection of Yc-66m MAA.
RADIOPHARMACEUTICALS:  40 mCi 3echnetium-WWm DTPA aerosol inhalation
and 6 mCi 3echnetium-WWm MAA IV

[Series 1: ant/post vent · 4.14mm/px · 1 of 1 slices shown (1 of 2)]
[im 1/1]
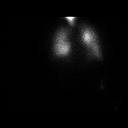

[Series 1: ant/post vent · 4.14mm/px · 1 of 1 slices shown (2 of 2)]
[im 1/1]
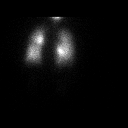

[Series 2: lao/rpo vent · 4.14mm/px · 1 of 1 slices shown (1 of 2)]
[im 1/1]
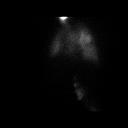

[Series 2: lao/rpo vent · 4.14mm/px · 1 of 1 slices shown (2 of 2)]
[im 1/1]
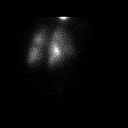

[Series 3: lpo/rao vent · 4.14mm/px · 1 of 1 slices shown (1 of 2)]
[im 1/1]
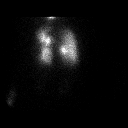

[Series 3: lpo/rao vent · 4.14mm/px · 1 of 1 slices shown (2 of 2)]
[im 1/1]
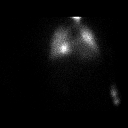

[Series 4: lt lat/rt lat vent · 4.14mm/px · 1 of 1 slices shown (1 of 2)]
[im 1/1]
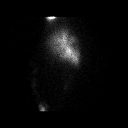

[Series 4: lt lat/rt lat vent · 4.14mm/px · 1 of 1 slices shown (2 of 2)]
[im 1/1]
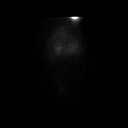

[Series 5: lt lat/rt lat perf · 4.14mm/px · 1 of 1 slices shown (1 of 2)]
[im 1/1]
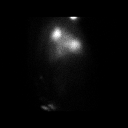

[Series 5: lt lat/rt lat perf · 4.14mm/px · 1 of 1 slices shown (2 of 2)]
[im 1/1]
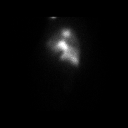

[Series 6: lpo/rao perf · 4.14mm/px · 1 of 1 slices shown (1 of 2)]
[im 1/1]
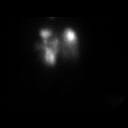

[Series 6: lpo/rao perf · 4.14mm/px · 1 of 1 slices shown (2 of 2)]
[im 1/1]
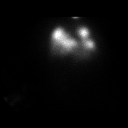

[Series 7: ant/post perf · 4.14mm/px · 1 of 1 slices shown (1 of 2)]
[im 1/1]
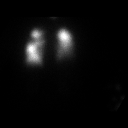

[Series 7: ant/post perf · 4.14mm/px · 1 of 1 slices shown (2 of 2)]
[im 1/1]
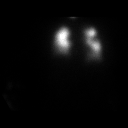

[Series 8: lao/rpo perf · 4.14mm/px · 1 of 1 slices shown (1 of 2)]
[im 1/1]
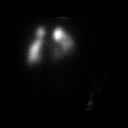

[Series 8: lao/rpo perf · 4.14mm/px · 1 of 1 slices shown (2 of 2)]
[im 1/1]
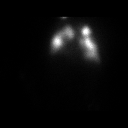

[16 of 16 positions shown; findings below may reference images not displayed]

FINDINGS: Ventilation: No focal ventilatory abnormality. Mild deposition of
aerosol in the left upper lobe and right lower lobe airways
accounting for focal hot spots.

Perfusion: Solitary large segmental wedge-shaped perfusion defect
involving the lateral left upper lobe.
IMPRESSION: Intermediate (20-79%) probability of pulmonary embolism based on
PIOPED II criteria.

## 2017-06-16 DEATH — deceased
